# Patient Record
Sex: Female | Born: 1980 | Race: Asian | Hispanic: No | Marital: Married | Smoking: Never smoker
Health system: Southern US, Community
[De-identification: ages and names within clinical notes are randomized; demographics above are authoritative.]

## PROBLEM LIST (undated history)

## (undated) DIAGNOSIS — Z789 Other specified health status: Secondary | ICD-10-CM

## (undated) DIAGNOSIS — T7840XA Allergy, unspecified, initial encounter: Secondary | ICD-10-CM

## (undated) HISTORY — PX: NO PAST SURGERIES: SHX2092

## (undated) HISTORY — DX: Allergy, unspecified, initial encounter: T78.40XA

---

## 2001-07-12 HISTORY — PX: FACIAL COSMETIC SURGERY: SHX629

## 2005-04-08 ENCOUNTER — Inpatient Hospital Stay (HOSPITAL_COMMUNITY): Admission: AD | Admit: 2005-04-08 | Discharge: 2005-04-08 | Payer: Self-pay | Admitting: *Deleted

## 2005-04-09 ENCOUNTER — Inpatient Hospital Stay (HOSPITAL_COMMUNITY): Admission: AD | Admit: 2005-04-09 | Discharge: 2005-04-09 | Payer: Self-pay | Admitting: *Deleted

## 2005-04-09 ENCOUNTER — Encounter (INDEPENDENT_AMBULATORY_CARE_PROVIDER_SITE_OTHER): Payer: Self-pay | Admitting: Specialist

## 2006-01-19 ENCOUNTER — Inpatient Hospital Stay (HOSPITAL_COMMUNITY): Admission: AD | Admit: 2006-01-19 | Discharge: 2006-01-19 | Payer: Self-pay | Admitting: Obstetrics and Gynecology

## 2006-08-14 ENCOUNTER — Inpatient Hospital Stay (HOSPITAL_COMMUNITY): Admission: AD | Admit: 2006-08-14 | Discharge: 2006-08-17 | Payer: Self-pay | Admitting: Obstetrics and Gynecology

## 2008-07-27 ENCOUNTER — Inpatient Hospital Stay (HOSPITAL_COMMUNITY): Admission: AD | Admit: 2008-07-27 | Discharge: 2008-07-29 | Payer: Self-pay | Admitting: Obstetrics and Gynecology

## 2010-10-26 LAB — CBC: MCHC: 32.9 g/dL (ref 30.0–36.0)

## 2010-10-26 LAB — RPR: RPR Ser Ql: NONREACTIVE

## 2010-11-27 NOTE — Discharge Summary (Signed)
NAMEHAVA, Alicia Werner                   ACCOUNT NO.:  [id number]   MEDICAL RECORD NO.:  [id number]          PATIENT TYPE:  INP   LOCATION:  9122                          FACILITY:  [facility]   PHYSICIAN:  Gerrit Friends. Aldona Bar, M.D.   DATE OF BIRTH:  05-31-1981   DATE OF ADMISSION:  08/14/2006  DATE OF DISCHARGE:  08/17/2006                               DISCHARGE SUMMARY   DISCHARGE DIAGNOSES:  1. Term pregnancy, delivered 8 pound 5 ounce female infant, Apgars 8 and      9.  2. Blood type B+.   PROCEDURES:  1. Vacuum extraction assisted delivery.  2. Second degree tear and repair.   SUMMARY:  This 30 year old gravida 3, para 0 was admitted at term in  labor after an uncomplicated pregnancy. She progressed well and required  because of severe variable decelerations and fetal heart a vacuum  extraction assisted delivery which was accomplished without difficulty  with delivery of an 8 pound 5 ounce female infant over a second degree  tear. Apgars were 8 and 9. Postpartum course was totally benign.  Discharge hemoglobin 10.7 with a white count of 10,000, platelet count  140,000. On the morning of February 6 she was ambulating well,  tolerating a regular diet well, having normal bowel and bladder  function. Was afebrile. Her breastfeeding was going well and after  giving her all appropriate instructions per discharge brochure, she was  discharged to home.   DISCHARGE MEDICATIONS:  1. Vitamins - one a day as long she is breastfeeding.  2. Feosol capsules - one at least every other day.  3. Motrin 600 mg every six hours as needed for pain and cramping.  4. Tylox 1-2 every 4-6 hours as needed for more severe pain.   FOLLOWUP:  She will return to the office for followup in approximately  four weeks' time or as needed.   CONDITION ON DISCHARGE:  Improved.      Gerrit Friends. Aldona Bar, M.D.  Electronically Signed     RMW/MEDQ  D:  08/17/2006  T:  08/17/2006  Job:  [id number]

## 2011-11-29 ENCOUNTER — Encounter: Payer: Self-pay | Admitting: *Deleted

## 2011-11-29 ENCOUNTER — Encounter: Payer: 59 | Attending: Obstetrics and Gynecology | Admitting: *Deleted

## 2011-11-29 VITALS — Ht 63.0 in | Wt 127.9 lb

## 2011-11-29 DIAGNOSIS — R7303 Prediabetes: Secondary | ICD-10-CM

## 2011-11-29 DIAGNOSIS — R7309 Other abnormal glucose: Secondary | ICD-10-CM | POA: Insufficient documentation

## 2011-11-29 DIAGNOSIS — Z713 Dietary counseling and surveillance: Secondary | ICD-10-CM | POA: Insufficient documentation

## 2011-11-29 NOTE — Progress Notes (Signed)
  Medical Nutrition Therapy:  Appt start time: 0800 end time:  0900.   Assessment:  Primary concerns today: Pre-diabetes. Patient here with husband regarding abnormal glucose levels. Patient speaks some [place]. Her HgbA1c was 6.1 on 10/18/11. She works as a Advertising account planner. She reports she is trying to lose weight (goal weight 120 pounds) however, current weight (127.9 pounds) is within normal weight range. She has had testing done for food allergies. Testing by [facility] indicates allergies to a wide range of food including shellfish, nuts, a variety of vegetables, fruits, and spices. However, patient only reports adverse effects after eating shellfish and pineapple. See media tab for allergy test results.    MEDICATIONS: Vitamin D supplement   DIETARY INTAKE:   Usual eating pattern includes 2-3 meals and 1-2 snacks per day.  24-hr recall:  B ( AM): Yogurt (homemade, whipped cream, milk) Snk ( AM): None  L ( PM): [employer], vegetable, fish/beef/pork at work from home Snk ( PM): Sweets sometimes D ( PM): Same as lunch Snk ( PM): None usually, dessert (ice cream), chips, popcorn Beverages: Water with honey, coffee with sugar and milk  Usual physical activity: Treadmill 1 hour/day walking/jogging for the last 2 weeks  Estimated energy needs: 1700 calories 190 g carbohydrates 106 g protein 57 g fat  Progress Towards Goal(s):  In progress.   Nutritional Diagnosis:  NB-1.1 Food and nutrition-related knowledge deficit As related to pre-diabetes.  As evidenced by no need for prior education.    Intervention:  Nutrition counseling. Discussed the physiology of pre-diabetes/diabetes, dietary strategies to reduce blood glucose including consistent carbohydrate intake and portion control, and the importance of physical activity. We also briefly discussed the patient's food allergies and which foods to avoid.   Goals: 1. 2-3 carbohydrate servings at meals, 1 carbohydrate serving at  snacks 2. Monitor portion size of carbohydrate containing foods. 3. Reduce sweet/snack food intake, replace with fruit, popcorn 4. Continue to walk/jog on the treadmill at least 4-5 days weekly.   Handouts given during visit include:  Carbohydrate counting booklet  Monitoring/Evaluation:  Dietary intake, exercise, blood glucose, and body weight in 1 month(s).

## 2011-11-29 NOTE — Patient Instructions (Signed)
Goals: 1. 2-3 carbohydrate servings at meals, 1 carbohydrate serving at snacks 2. Monitor portion size of carbohydrate containing foods. 3. Reduce sweet/snack food intake, replace with fruit, popcorn 4. Continue to walk/jog on the treadmill at least 4-5 days weekly.

## 2012-01-03 ENCOUNTER — Encounter: Payer: Self-pay | Admitting: *Deleted

## 2012-01-03 ENCOUNTER — Encounter: Payer: 59 | Attending: Obstetrics and Gynecology | Admitting: *Deleted

## 2012-01-03 VITALS — Wt 123.5 lb

## 2012-01-03 DIAGNOSIS — Z713 Dietary counseling and surveillance: Secondary | ICD-10-CM | POA: Insufficient documentation

## 2012-01-03 DIAGNOSIS — R7309 Other abnormal glucose: Secondary | ICD-10-CM | POA: Insufficient documentation

## 2012-01-03 NOTE — Patient Instructions (Signed)
Goals:  1. 2-3 carbohydrate servings at meals, 1 carbohydrate serving at snacks  2. Continue to monitor portion size of carbohydrate containing foods.  3. Continue to replace sweets intake with fruit, popcorn  4. Continue to walk/jog on the treadmill at least 4-5 days weekly

## 2012-01-03 NOTE — Progress Notes (Signed)
  Medical Nutrition Therapy:  Appt start time: 0800 end time:  0900.   Assessment:  Patient reports making changes to her diet. She is eating less sweets and drinking more water. Her weight is down to 123.5 pounds from 127 pounds. Despite weight in normal BMI range, her reported goal weight is 120 pounds. She has been running 1 hour on the treadmill daily. She has also been taking a lactase supplement on the recommendation of a friend and states that she feels better with meals. We discussed that this is useful when eating dairy products and does not need to be taken with all meals.   MEDICATIONS: Vitamin D, Dairy Digested lactase supplement   DIETARY INTAKE:   Usual eating pattern includes 3 meals and 1-2 snacks per day.  24-hr recall:  B ( AM): Whole wheat, eggs, fat-free cheese Snk ( AM): None  L ( PM): Brown rice, meat/fish/pork, vegetables Snk ( PM): none usually, sometimes fruit D ( PM): Same as lunch Snk ( PM): Fruit Beverages: Water, lemonade  Usual physical activity: Running treadmill 1 hour daily, yoga 3 days a week   Progress Towards Goal(s):  In progress.   Nutritional Diagnosis:  NB-1.1 Food and nutrition-related knowledge deficit As related to pre-diabetes.  As evidenced by no prior education.    Intervention:  Nutrition counseling. Reviewed carbohydrate control with patient and husband. We reviewed portion size and the importance of eating consistent carbs throughout the day.  Goals:  1. 2-3 carbohydrate servings at meals, 1 carbohydrate serving at snacks  2. Continue to monitor portion size of carbohydrate containing foods.  3. Continue to replace sweets intake with fruit, popcorn  4. Continue to walk/jog on the treadmill at least 4-5 days weekly  Handouts given during visit include:  Yellow carb counting handout   Monitoring/Evaluation:  Dietary intake, exercise, and body weight prn.

## 2012-02-20 ENCOUNTER — Ambulatory Visit (INDEPENDENT_AMBULATORY_CARE_PROVIDER_SITE_OTHER): Payer: 59 | Admitting: Emergency Medicine

## 2012-02-20 ENCOUNTER — Ambulatory Visit: Payer: 59

## 2012-02-20 VITALS — BP 94/65 | HR 91 | Temp 98.9°F | Resp 18 | Ht 64.0 in | Wt 122.8 lb

## 2012-02-20 DIAGNOSIS — R509 Fever, unspecified: Secondary | ICD-10-CM

## 2012-02-20 DIAGNOSIS — J158 Pneumonia due to other specified bacteria: Secondary | ICD-10-CM

## 2012-02-20 DIAGNOSIS — J069 Acute upper respiratory infection, unspecified: Secondary | ICD-10-CM

## 2012-02-20 DIAGNOSIS — J189 Pneumonia, unspecified organism: Secondary | ICD-10-CM

## 2012-02-20 LAB — POCT CBC
HCT, POC: 40.4 % (ref 37.7–47.9)
MCV: 85.4 fL (ref 80–97)
MID (cbc): 0.5 (ref 0–0.9)
POC MID %: 4.7 %M (ref 0–12)
Platelet Count, POC: 225 10*3/uL (ref 142–424)
RBC: 4.73 M/uL (ref 4.04–5.48)
RDW, POC: 13 %

## 2012-02-20 LAB — GLUCOSE, POCT (MANUAL RESULT ENTRY): POC Glucose: 84 mg/dl (ref 70–99)

## 2012-02-20 LAB — POCT GLYCOSYLATED HEMOGLOBIN (HGB A1C): Hemoglobin A1C: 5.5

## 2012-02-20 MED ORDER — CEFTRIAXONE SODIUM 1 G IJ SOLR
1.0000 g | Freq: Once | INTRAMUSCULAR | Status: AC
  Administered 2012-02-20: 1 g via INTRAMUSCULAR

## 2012-02-20 MED ORDER — AZITHROMYCIN 250 MG PO TABS: ORAL_TABLET | ORAL | Status: AC

## 2012-02-20 NOTE — Progress Notes (Signed)
  Subjective:    Patient ID: Alicia Werner, female    DOB: Mar 07, 1981, 31 y.o.   MRN: [id number]  HPI 31 year old female comes into because of cough and sore throat has taken tylenol and ibuprofen for fever it doesn't help Cough keeps her up at night coughs up green mucus her chest hurts when she cough    Review of Systems  Constitutional: Positive for fever, chills and fatigue.  HENT: Positive for congestion, sore throat, sneezing and sinus pressure.   Respiratory: Positive for cough and chest tightness (because of cough and congestion).   Neurological: Positive for headaches.       Objective:   Physical Exam  Constitutional: She appears well-developed and well-nourished.  HENT:  Head: Normocephalic.  Eyes: Pupils are equal, round, and reactive to light.  Neck: No thyromegaly present.  Cardiovascular: Normal rate and regular rhythm.   Pulmonary/Chest:       There are rhonchi present right lower lobe right lateral chest   [facility] reading (PRIMARY) by  Dr. Cleta Alberts chest x-ray shows a consolidated right middle lobe pneumonia . Results for orders placed in visit on 02/20/12  POCT CBC      Component Value Range   WBC 10.8 (*) 4.6 - 10.2 K/uL   Lymph, poc 1.1  0.6 - 3.4   POC LYMPH PERCENT 9.9 (*) 10 - 50 %L   MID (cbc) 0.5  0 - 0.9   POC MID % 4.7  0 - 12 %M   POC Granulocyte 9.2 (*) 2 - 6.9   Granulocyte percent 85.4 (*) 37 - 80 %G   RBC 4.73  4.04 - 5.48 M/uL   Hemoglobin 12.1 (*) 12.2 - 16.2 g/dL   HCT, POC 40.9  81.1 - 47.9 %   MCV 85.4  80 - 97 fL   MCH, POC 25.6 (*) 27 - 31.2 pg   MCHC 30.0 (*) 31.8 - 35.4 g/dL   RDW, POC 91.4     Platelet Count, POC 225  142 - 424 K/uL   MPV 10.6  0 - 99.8 fL  GLUCOSE, POCT (MANUAL RESULT ENTRY)      Component Value Range   POC Glucose 84  70 - 99 mg/dl  POCT GLYCOSYLATED HEMOGLOBIN (HGB A1C)      Component Value Range   Hemoglobin A1C 5.5            Assessment & Plan:    Patient to receive 1 g of Rocephin followed by  Zithromax. Recheck 48 hours patient will need repeat chest x-ray in about 2 weeks.

## 2012-02-20 NOTE — Patient Instructions (Addendum)
He needs a recheck in the office in 48 hours on Tuesday. Take antibiotics as prescribed the we will repeat a chest x-ray in 10-14 days.B?nh Vim Ph?i ? Ng??i L?n (Pneumonia, Adult) Vim ph?i l b?nh nhi?m trng ph?i.  NGUYN NHN N c th? gy ra b?i vi khu?n ho?c vi rt. Thng th??ng, cc nhi?m trng ny l do ht cc h?t truy?n nhi?m vo ph?i (???ng h h?p). TRI?U CH?NG   Ho.   S?t.   ?au ng?c.   Th? nhanh.   Kh kh.   S?n sinh d?ch nh?y.  CH?N ?ON N?u b?n c cc tri?u ch?ng ph? bi?n c?a b?nh vim ph?i, chuyn gia ch?m [place] y t? c?a b?n th??ng s? xc nh?n ch?n ?on b?ng X-quang ph?i. X-quang s? hi?n th? s? b?t th??ng trong ph?i (thm nh?p vo ph?i) n?u b?n b? vim ph?i. Cc xt nghi?m mu, n??c ti?u ho?c ??m c th? ???c th?c hi?n ?? tm ra nguyn nhn c? th? c?a b?nh vim ph?i c?a b?n. Chuyn gia ch?m [place] y t? c?a b?n c th? lm cc xt nghi?m (?o p l?c kh trong mu ho?c ?o ?? bo ha -xy) ?? xem ph?i c?a b?n ?ang lm vi?c nh? th? no. ?I?U TR?  M?t s? d?ng vim ph?i c th? ly lan sang ng??i khc khi b?n ho ho?c h?t h?i. B?n c th? ???c yu c?u ?eo kh?u trang tr??c v trong qu trnh khm. Vim ph?i do vi khu?n ???c ?i?u tr? b?ng thu?c khng sinh. Vim ph?i do vi rt cm c th? ???c ?i?u tr? b?ng thu?c khng vi rt. H?u h?t cc b?nh nhi?m vi rt khc ph?i ?i h?t ti?n trnh c?a chng. Cc b?nh nhi?m trng ny s? khng ?p ?ng v?i thu?c khng sinh.  PHNG NG?A Thu?c ch?ng ng?a (lo?i tim) nhi?m ph? c?u trng gip phng ng?a vim ph?i do ph? c?u trng. ?i?u ny th??ng ???c ?? ngh? cho:  Nh?ng ng??i trn 63 tu?i.   B?nh nhn ha tr? li?u.   Nh?ng ng??i c v?n ?? v? ph?i mn tnh, ch?ng h?n nh? vim ti?u ph? qu?n ho?c kh th?ng.   Nh?ng ng??i c v?n ?? h? th?ng mi?n d?ch.  N?u b?n trn 63 tu?i ho?c c m?t tnh tr?ng nguy c? cao, b?n c th? nh?n ???c v?cxin ph? c?u khu?n n?u b?n khng nh?n ???c n tr??c ?. ? m?t s? n??c, v?cxin cm th??ng xuyn c?ng ???c ?? ngh?Marland Kitchen V?cxin ny c th?  gip phng ng?a m?t s? tr??ng h?p vim ph?i. B?n c th? s? ???c cung c?p v?cxin cm nh? l m?t ph?n c?a s? ch?m [place].  N?u b?n ht thu?c, ? t?i lc b? thu?c. B?n c th? ???c h??ng d?n v? cch d?ng ht thu?c t?t nh?t. Chuyn gia ch?m [place] y t? c?a b?n c th? cung c?p thu?c v t? v?n ?? gip b?n b? thu?c l. H??NG D?N CH?M [place] T?I NH  Thu?c ho c th? ???c s? d?ng n?u b?n khng ???c ngh? ng?i nhi?u. Tuy nhin, ho b?o v? b?n b?ng cch lm s?ch ph?i. B?n nn trnh s? d?ng thu?c ho n?u c th?Shaune Pascal gia ch?m [place] y t? c?a b?n c th? ? k ??n thu?c n?u ngh? r?ng b?n b? vim do vi khu?n ho?c cm. Dng h?t chng ngay c? khi b?n b?t ??u c?m th?y kh h?n.   Bc s? ? c?ng c th? k ??n thu?c long ??m c tc d?ng lm bong ??m ?? ho ra.  Ch? dng cc thu?c ???c bn khng c?n ??n thu?c c?a Bc s? ho?c theo toa c?a Bc s? ?? gi?m ?au, kh ch?u, hay s?t theo nh? h??ng d?n c?a Bc s?.   Khng ht thu?c l. Ht thu?c l l nguyn nhn ph? bi?n c?a vim ph? qu?n v c th? gp ph?n vo vim ph?i. N?u b?n ht thu?c v ti?p t?c ht thu?c, ch?ng ho c?a b?n c th? ko di vi tu?n sau khi h?t vim ph?i.   My phun h?i n??c mt ho?c my t?o h?i ?m trong phng hay nh b?n c th? gip lm long d?ch nh?y.   Ho th??ng n?ng h?n vo ban ?m. Ng? ? t? th? n?a th?ng ??ng trong m?t chi?c gh? t?a ho?c s? d?ng m?t ?i g?i d??i ??u s? gip lm d?u b?t v?n ?? ny.   Ngh? ng?i khi b?n c?m th?y c?n. C? th? c?a b?n th??ng s? cho b?n bi?t khi no c?n ngh? ng?i.  HY NGAY L?P T?C THAM V?N V?I CHUYN GIA Y T? N?U:  B?nh c?a b?n tr? nn t? h?n. ?i?u ny ??c bi?t ?ng n?u b?n ? c tu?i ho?c b? suy nh??c do b?t k? b?nh no khc.   B?n khng th? ki?m sot b?nh ho b?ng thu?c ch?a ho v m?t ng?.   B?n b?t ??u ho ra mu.   B?n ngy cng ?au h?n ho?c u?ng thu?c khng c tc d?ng gi?m ?au.   B?n b? s?t.   B?t k? tri?u ch?ng no ban ??u ??a b?n ??n ?i?u tr? tr? nn nghim tr?ng h?n thay v t?t h?n.   B?n pht tri?n th? d?c ho?c ?au  ng?c.  HY CH?C CH?N R?NG B?N:  Hi?u r nh?ng h??ng d?n khi xu?t vi?n.   S? theo di tnh tr?ng b?nh c?a b?n.   S? ??n khm b?nh ngay l?p t?c nh? ? ???c h??ng d?n.  Document Released: 06/28/2005 Document Revised: 06/17/2011 [facility] Patient Information 2012 [place], [place].Pneumonia, Adult Pneumonia is an infection of the lungs.  CAUSES Pneumonia may be caused by bacteria or a virus. Usually, these infections are caused by breathing infectious particles into the lungs (respiratory tract). SYMPTOMS   Cough.   Fever.   Chest pain.   Increased rate of breathing.   Wheezing.   Mucus production.  DIAGNOSIS  If you have the common symptoms of pneumonia, your caregiver will typically confirm the diagnosis with a chest X-ray. The X-ray will show an abnormality in the lung (pulmonary infiltrate) if you have pneumonia. Other tests of your blood, urine, or sputum may be done to find the specific cause of your pneumonia. Your caregiver may also do tests (blood gases or pulse oximetry) to see how well your lungs are working. TREATMENT  Some forms of pneumonia may be spread to other people when you cough or sneeze. You may be asked to wear a mask before and during your exam. Pneumonia that is caused by bacteria is treated with antibiotic medicine. Pneumonia that is caused by the influenza virus may be treated with an antiviral medicine. Most other viral infections must run their course. These infections will not respond to antibiotics.  PREVENTION A pneumococcal shot (vaccine) is available to prevent a common bacterial cause of pneumonia. This is usually suggested for:  People over 2 years old.   Patients on chemotherapy.   People with chronic lung problems, such as bronchitis or emphysema.   People with immune system problems.  If you are over 65 or  have a high risk condition, you may receive the pneumococcal vaccine if you have not received it before. In some countries, a routine  influenza vaccine is also recommended. This vaccine can help prevent some cases of pneumonia.You may be offered the influenza vaccine as part of your care. If you smoke, it is time to quit. You may receive instructions on how to stop smoking. Your caregiver can provide medicines and counseling to help you quit. HOME CARE INSTRUCTIONS   Cough suppressants may be used if you are losing too much rest. However, coughing protects you by clearing your lungs. You should avoid using cough suppressants if you can.   Your caregiver may have prescribed medicine if he or she thinks your pneumonia is caused by a bacteria or influenza. Finish your medicine even if you start to feel better.   Your caregiver may also prescribe an expectorant. This loosens the mucus to be coughed up.   Only take over-the-counter or prescription medicines for pain, discomfort, or fever as directed by your caregiver.   Do not smoke. Smoking is a common cause of bronchitis and can contribute to pneumonia. If you are a smoker and continue to smoke, your cough may last several weeks after your pneumonia has cleared.   A cold steam vaporizer or humidifier in your room or home may help loosen mucus.   Coughing is often worse at night. Sleeping in a semi-upright position in a recliner or using a couple pillows under your head will help with this.   Get rest as you feel it is needed. Your body will usually let you know when you need to rest.  SEEK IMMEDIATE MEDICAL CARE IF:   Your illness becomes worse. This is especially true if you are elderly or weakened from any other disease.   You cannot control your cough with suppressants and are losing sleep.   You begin coughing up blood.   You develop pain which is getting worse or is uncontrolled with medicines.   You have a fever.   Any of the symptoms which initially brought you in for treatment are getting worse rather than better.   You develop shortness of breath or chest  pain.  MAKE SURE YOU:   Understand these instructions.   Will watch your condition.   Will get help right away if you are not doing well or get worse.  Document Released: 06/28/2005 Document Revised: 06/17/2011 Document Reviewed: 09/17/2010 [facility] Patient Information 2012 [place], [place].

## 2012-02-22 ENCOUNTER — Ambulatory Visit (INDEPENDENT_AMBULATORY_CARE_PROVIDER_SITE_OTHER): Payer: 59 | Admitting: Family Medicine

## 2012-02-22 VITALS — BP 99/67 | HR 82 | Temp 98.1°F | Resp 16 | Ht 64.0 in | Wt 121.0 lb

## 2012-02-22 DIAGNOSIS — J189 Pneumonia, unspecified organism: Secondary | ICD-10-CM

## 2012-02-22 LAB — POCT CBC
Granulocyte percent: 69.3 %G (ref 37–80)
HCT, POC: 40 % (ref 37.7–47.9)
Hemoglobin: 12.3 g/dL (ref 12.2–16.2)
Lymph, poc: 1.9 (ref 0.6–3.4)
MCH, POC: 26.4 pg — AB (ref 27–31.2)
MCHC: 30.8 g/dL — AB (ref 31.8–35.4)
MCV: 85.8 fL (ref 80–97)
MID (cbc): 0.6 (ref 0–0.9)
MPV: 9.8 fL (ref 0–99.8)
POC Granulocyte: 5.6 (ref 2–6.9)
POC LYMPH PERCENT: 23.9 %L (ref 10–50)
POC MID %: 6.8 %M (ref 0–12)
Platelet Count, POC: 281 10*3/uL (ref 142–424)
RBC: 4.66 M/uL (ref 4.04–5.48)
RDW, POC: 12.8 %
WBC: 8.1 10*3/uL (ref 4.6–10.2)

## 2012-02-22 MED ORDER — HYDROCODONE-HOMATROPINE 5-1.5 MG/5ML PO SYRP
5.0000 mL | ORAL_SOLUTION | Freq: Three times a day (TID) | ORAL | Status: AC | PRN
Start: 2012-02-22 — End: 2012-03-03

## 2012-02-22 NOTE — Progress Notes (Signed)
This is a 31 year old [place] woman who was diagnosed with pneumonia yesterday.  She was given a shot of Rocephin and started on azithromycin. Overnight patient was very uncomfortable with cough but had no known fever. She's had no shortness of breath. She says that her right ear is a bit uncomfortable as well as her right neck.  She's been healthy in general her whole life the last week a friend died and she was out late every night to do the family according to tradition.  Objective: Patient shows no sign of respiratory difficulty, she is a bit haggard, however  HEENT: Normal  Chest: Decreased breath sounds right anterior lung fields  Heart: Regular no murmur  Skin: No rashes Results for orders placed in visit on 02/22/12  POCT CBC      Component Value Range   WBC 8.1  4.6 - 10.2 K/uL   Lymph, poc 1.9  0.6 - 3.4   POC LYMPH PERCENT 23.9  10 - 50 %L   MID (cbc) 0.6  0 - 0.9   POC MID % 6.8  0 - 12 %M   POC Granulocyte 5.6  2 - 6.9   Granulocyte percent 69.3  37 - 80 %G   RBC 4.66  4.04 - 5.48 M/uL   Hemoglobin 12.3  12.2 - 16.2 g/dL   HCT, POC 16.1  09.6 - 47.9 %   MCV 85.8  80 - 97 fL   MCH, POC 26.4 (*) 27 - 31.2 pg   MCHC 30.8 (*) 31.8 - 35.4 g/dL   RDW, POC 04.5     Platelet Count, POC 281  142 - 424 K/uL   MPV 9.8  0 - 99.8 fL    Assessment:  No acute deterioration but patient is uncomfortable  Plan: continue current meds. 1. Pneumonia  POCT CBC, HYDROcodone-homatropine (HYCODAN) 5-1.5 MG/5ML syrup   Recheck in 48 hours

## 2012-02-24 ENCOUNTER — Ambulatory Visit (INDEPENDENT_AMBULATORY_CARE_PROVIDER_SITE_OTHER): Payer: 59 | Admitting: Emergency Medicine

## 2012-02-24 ENCOUNTER — Ambulatory Visit: Payer: 59

## 2012-02-24 VITALS — BP 97/61 | HR 79 | Temp 98.2°F | Resp 16 | Ht 64.0 in | Wt 122.0 lb

## 2012-02-24 DIAGNOSIS — J189 Pneumonia, unspecified organism: Secondary | ICD-10-CM

## 2012-02-24 DIAGNOSIS — M79609 Pain in unspecified limb: Secondary | ICD-10-CM

## 2012-02-24 DIAGNOSIS — M79604 Pain in right leg: Secondary | ICD-10-CM

## 2012-02-24 DIAGNOSIS — R110 Nausea: Secondary | ICD-10-CM

## 2012-02-24 NOTE — Progress Notes (Signed)
  Subjective:    Patient ID: Alicia Werner, female    DOB: [birth year], 31 y.o.   MRN: [id number]  HPI patient under treatment for pneumonia. She is on call for medications at night and recently finished a Z-Pak. She had some nausea and felt some weakness on the right side of her body however she had a red treatment followed by coining and now feels better today her normal breakfast and a normal lunch and feels well today. I discussed case with her husband and she is having tightness in the shoulder muscles on the right and pain of her right leg and buttocks where she had a Rocephin shot .    Review of Systems     Objective:   Physical Exam patient was fine she is in no distress. Her neck is supple. She has had coining procedure done on the back of her neck and shoulders. Her strength of the upper extremities and lower extremities is normal. There are no focal neurological signs. Her chest today was clear to percussion and her cardiac exam is unremarkable  [facility] reading (PRIMARY) by  Dr. Cleta Alberts decrease in right middle lobe infiltrate with some improved aeration         Assessment & Plan:  Chest x-ray is definitely better I'm not sure what to make of the right-sided weakness and some of her symptoms may be lost in translation. Apparently she had a massage last night and feels better today and had a normal breakfast and lunch and plans to return to work. Her breathing has been normal. I did not detect any abnormalities on her neurological exam.

## 2012-09-09 ENCOUNTER — Encounter: Payer: Self-pay | Admitting: *Deleted

## 2012-09-09 DIAGNOSIS — L2089 Other atopic dermatitis: Secondary | ICD-10-CM | POA: Insufficient documentation

## 2012-09-09 DIAGNOSIS — J3089 Other allergic rhinitis: Secondary | ICD-10-CM | POA: Insufficient documentation

## 2012-09-09 DIAGNOSIS — H1045 Other chronic allergic conjunctivitis: Secondary | ICD-10-CM | POA: Insufficient documentation

## 2012-09-09 DIAGNOSIS — T783XXA Angioneurotic edema, initial encounter: Secondary | ICD-10-CM | POA: Insufficient documentation

## 2012-11-05 ENCOUNTER — Ambulatory Visit (INDEPENDENT_AMBULATORY_CARE_PROVIDER_SITE_OTHER): Payer: 59 | Admitting: Family Medicine

## 2012-11-05 VITALS — BP 94/59 | HR 76 | Temp 97.9°F | Resp 16 | Ht 64.0 in | Wt 125.4 lb

## 2012-11-05 DIAGNOSIS — J302 Other seasonal allergic rhinitis: Secondary | ICD-10-CM

## 2012-11-05 DIAGNOSIS — J309 Allergic rhinitis, unspecified: Secondary | ICD-10-CM

## 2012-11-05 MED ORDER — OLOPATADINE HCL 0.1 % OP SOLN: 1.0000 [drp] | Freq: Two times a day (BID) | OPHTHALMIC | Status: DC

## 2012-11-05 MED ORDER — PREDNISONE 20 MG PO TABS: ORAL_TABLET | ORAL | Status: DC

## 2012-11-05 MED ORDER — MOMETASONE FUROATE 50 MCG/ACT NA SUSP: 2.0000 | Freq: Every day | NASAL | Status: DC

## 2012-11-05 NOTE — Patient Instructions (Addendum)
Allergic Rhinitis  Allergic rhinitis is when the mucous membranes in the nose respond to allergens. Allergens are particles in the air that cause your body to have an allergic reaction. This causes you to release allergic antibodies. Through a chain of events, these eventually cause you to release histamine into the blood stream (hence the use of antihistamines). Although meant to be protective to the body, it is this release that causes your discomfort, such as frequent sneezing, congestion and an itchy runny nose.    CAUSES    The pollen allergens may come from grasses, trees, and weeds. This is seasonal allergic rhinitis, or "hay fever." Other allergens cause year-round allergic rhinitis (perennial allergic rhinitis) such as house dust mite allergen, pet dander and mold spores.    SYMPTOMS     Nasal stuffiness (congestion).   Runny, itchy nose with sneezing and tearing of the eyes.   There is often an itching of the mouth, eyes and ears.  It cannot be cured, but it can be controlled with medications.  DIAGNOSIS    If you are unable to determine the offending allergen, skin or blood testing may find it.  TREATMENT     Avoid the allergen.   Medications and allergy shots (immunotherapy) can help.   Hay fever may often be treated with antihistamines in pill or nasal spray forms. Antihistamines block the effects of histamine. There are over-the-counter medicines that may help with nasal congestion and swelling around the eyes. Check with your caregiver before taking or giving this medicine.  If the treatment above does not work, there are many new medications your caregiver can prescribe. Stronger medications may be used if initial measures are ineffective. Desensitizing injections can be used if medications and avoidance fails. Desensitization is when a patient is given ongoing shots until the body becomes less sensitive to the allergen. Make sure you follow up with your caregiver if problems continue.   SEEK MEDICAL CARE IF:     You develop fever (more than 100.5 F (38.1 C).   You develop a cough that does not stop easily (persistent).   You have shortness of breath.   You start wheezing.   Symptoms interfere with normal daily activities.  Document Released: 03/23/2001 Document Revised: 09/20/2011 Document Reviewed: 10/02/2008  [employer] Patient Information 2013 [employer].

## 2012-11-05 NOTE — Progress Notes (Signed)
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 She has gotten much more swelling in cheeks lately associated with nasal congestion, right pink eye, sore throat. Gets allergy shots x 3 years without improvements.   Obj:  Allergic facies HEENT:  Marked nasal passage swelling Eye:  Right eye injected Oroph:  PND only  Assessment and Plan:  Seasonal allergies - Plan: olopatadine (PATANOL) 0.1 % ophthalmic solution, predniSONE (DELTASONE) 20 MG tablet, mometasone (NASONEX) 50 MCG/ACT nasal spray

## 2012-11-21 ENCOUNTER — Other Ambulatory Visit: Payer: Self-pay | Admitting: Obstetrics and Gynecology

## 2012-12-12 ENCOUNTER — Ambulatory Visit (INDEPENDENT_AMBULATORY_CARE_PROVIDER_SITE_OTHER): Payer: 59 | Admitting: Family Medicine

## 2012-12-12 VITALS — BP 110/80 | HR 71 | Temp 98.0°F | Resp 16 | Ht 64.0 in | Wt 125.0 lb

## 2012-12-12 DIAGNOSIS — R7309 Other abnormal glucose: Secondary | ICD-10-CM

## 2012-12-12 DIAGNOSIS — B353 Tinea pedis: Secondary | ICD-10-CM

## 2012-12-12 DIAGNOSIS — R739 Hyperglycemia, unspecified: Secondary | ICD-10-CM

## 2012-12-12 LAB — GLUCOSE, POCT (MANUAL RESULT ENTRY): POC Glucose: 84 mg/dl (ref 70–99)

## 2012-12-12 LAB — POCT GLYCOSYLATED HEMOGLOBIN (HGB A1C): Hemoglobin A1C: 5.5

## 2012-12-12 MED ORDER — TERBINAFINE HCL 1 % EX CREA: TOPICAL_CREAM | Freq: Two times a day (BID) | CUTANEOUS | Status: DC

## 2012-12-12 NOTE — Progress Notes (Signed)
Subjective:    Patient ID: Alicia Werner, female    DOB: 1980/10/02, 32 y.o.   MRN: [id number] Chief Complaint  Patient presents with  . Hyperglycemia    and also pt is always cold  . itching both heels    HPI  Alicia Werner is a 32 yo female w/ a PMHx of hyperglycemia, referred to nutrition last yr by ob-gyn as hgb was 6.1.  Nutrition found her hgb to be 5.5. Since then pt has been diligent about getting plenty of exercise and watching her diet - low fat, low carb.  She then saw her gyn, Dr. Dareen Piano at [facility] about 3 wks ago - they did blood work and told her she was almost diabetic - told sugar was very high.  She has been checking her sugars some mornings and is usually around 84-102. Her husband also has type 2 DM and he is very fit, then - but it run strongly in his family.  She has been using his meter.  Both heals very itchy for about 10d - 2wks - not using any lotions or products.  Only person in Alicia Werner's family w/ t2DM is her maternal GM.  Past Medical History  Diagnosis Date  . Allergy    Current Outpatient Prescriptions on File Prior to Visit  Medication Sig Dispense Refill  . cholecalciferol (VITAMIN D) 1000 UNITS tablet Take 1,000 Units by mouth daily.      . fish oil-omega-3 fatty acids 1000 MG capsule Take 2 g by mouth daily.      . mometasone (NASONEX) 50 MCG/ACT nasal spray Place 2 sprays into the nose daily.  17 g  12  . olopatadine (PATANOL) 0.1 % ophthalmic solution Place 1 drop into the right eye 2 (two) times daily.  5 mL  12  . phenylephrine (SUDAFED PE) 10 MG TABS Take 10 mg by mouth every 4 (four) hours as needed.      . predniSONE (DELTASONE) 20 MG tablet 2 daily with food  10 tablet  1   No current facility-administered medications on file prior to visit.   Allergies  Allergen Reactions  . Shellfish Allergy Hives and Swelling    Patient reports facial swelling, redness, and hives after eating shellfish. Allergy testing done.   Jacelyn Grip    Patient reports scratchy, sore throat after eating.    Family History  Problem Relation Age of Onset  . Asthma Son    History   Social History  . Marital Status: Married    Spouse Name: N/A    Number of Children: N/A  . Years of Education: N/A   Social History Main Topics  . Smoking status: Never Smoker   . Smokeless tobacco: None  . Alcohol Use: No  . Drug Use: No  . Sexually Active: Yes   Other Topics Concern  . None   Social History Narrative  . None   History reviewed. No pertinent past surgical history.    Review of Systems  Constitutional: Positive for chills, activity change and appetite change. Negative for fever, diaphoresis, fatigue and unexpected weight change.  Endocrine: Positive for cold intolerance. Negative for heat intolerance, polydipsia and polyphagia.  Musculoskeletal: Negative for myalgias, joint swelling and arthralgias.  Skin: Positive for rash.  Hematological: Negative for adenopathy. Does not bruise/bleed easily.      BP 110/80  Pulse 71  Temp(Src) 98 F (36.7 C) (Oral)  Resp 16  Ht 5\' 4"  (1.626 m)  Wt  125 lb (56.7 kg)  BMI 21.45 kg/m2  SpO2 100%  LMP 11/23/2012 Objective:   Physical Exam  Constitutional: She is oriented to person, place, and time. She appears well-developed and well-nourished. No distress.  HENT:  Head: Normocephalic and atraumatic.  Right Ear: External ear normal.  Left Ear: External ear normal.  Eyes: Conjunctivae are normal. No scleral icterus.  Neck: Normal range of motion. Neck supple. No thyromegaly present.  Cardiovascular: Normal rate, regular rhythm, normal heart sounds and intact distal pulses.   Pulmonary/Chest: Effort normal and breath sounds normal. No respiratory distress.  Musculoskeletal: She exhibits no edema.  Lymphadenopathy:    She has no cervical adenopathy.  Neurological: She is alert and oriented to person, place, and time.  Skin: Skin is warm and dry. She is not diaphoretic. No erythema.   Psychiatric: She has a normal mood and affect. Her behavior is normal.      Results for orders placed in visit on 12/12/12  POCT GLYCOSYLATED HEMOGLOBIN (HGB A1C)      Result Value Range   Hemoglobin A1C 5.5    GLUCOSE, POCT (MANUAL RESULT ENTRY)      Result Value Range   POC Glucose 84  70 - 99 mg/dl   Called [facility] to get copy of her labs faxed - they faxed a copy of 10/2011 labs which were already scanned into Epic.  Assistant called [facility] - after 20 min on hold was finally told that in 10/2011, pt's hgba1c was 6.4 and glucose was 81.  Copy of labs was never faxed as request. Assessment & Plan:  Hyperglycemia - Plan: POCT glycosylated hemoglobin (Hb A1C), POCT glucose (manual entry), Hemoglobin A1c, CANCELED: Hemoglobin A1c - 6.4 3 wks ago and now 5.5 w/o intervention.  Pt has not risk factors for DM.  Will send off a1c to [facility] for confirmation. Check cbgs 3-4x/wk - either before breakfast goal ~100 or 1-2 hrs after lunch goal <150.  If not at goal, RTC for further eval.  Tinea pedis  Meds ordered this encounter  Medications  . terbinafine (LAMISIL AT) 1 % cream    Sig: Apply topically 2 (two) times daily.    Dispense:  42 g    Refill:  2

## 2012-12-14 ENCOUNTER — Telehealth: Payer: Self-pay

## 2012-12-14 MED ORDER — TERBINAFINE HCL 1 % EX CREA: TOPICAL_CREAM | Freq: Two times a day (BID) | CUTANEOUS | Status: DC

## 2012-12-14 NOTE — Telephone Encounter (Signed)
Resent

## 2012-12-14 NOTE — Telephone Encounter (Signed)
Pt needs a cream that was prescribed at a visit this week.  Needs it sent to the [facility] on [address].  [phone]

## 2012-12-18 ENCOUNTER — Telehealth: Payer: Self-pay

## 2012-12-18 MED ORDER — KETOCONAZOLE 2 % EX CREA: TOPICAL_CREAM | Freq: Two times a day (BID) | CUTANEOUS | Status: DC

## 2012-12-18 NOTE — Telephone Encounter (Signed)
Pt husband is calling for his wife something to do with a prescription and her heel itching Call back number is [phone] He is on HIPPA form

## 2012-12-18 NOTE — Telephone Encounter (Signed)
Per the patient's husband, the terbinafine cream prescription wasn't sent. I spoke to the pharmacy, who advised me that they received the Rx, but it's not covered by the patient's insurance, so they placed it on hold. I cancelled the terbinafine and called in ketoconazole cream in it's place, in hopes the insurance will cover it.

## 2012-12-19 NOTE — Telephone Encounter (Signed)
Thanks, I have called to advise.

## 2013-08-05 ENCOUNTER — Ambulatory Visit (INDEPENDENT_AMBULATORY_CARE_PROVIDER_SITE_OTHER): Payer: 59 | Admitting: Emergency Medicine

## 2013-08-05 VITALS — BP 94/58 | HR 76 | Temp 98.0°F | Resp 16 | Ht 64.0 in | Wt 131.6 lb

## 2013-08-05 DIAGNOSIS — N912 Amenorrhea, unspecified: Secondary | ICD-10-CM

## 2013-08-05 LAB — COMPREHENSIVE METABOLIC PANEL
ALBUMIN: 4.1 g/dL (ref 3.5–5.2)
ALK PHOS: 37 U/L — AB (ref 39–117)
ALT: 12 U/L (ref 0–35)
AST: 17 U/L (ref 0–37)
BUN: 5 mg/dL — ABNORMAL LOW (ref 6–23)
CALCIUM: 8.7 mg/dL (ref 8.4–10.5)
CO2: 25 meq/L (ref 19–32)
Chloride: 106 mEq/L (ref 96–112)
Creat: 0.59 mg/dL (ref 0.50–1.10)
GLUCOSE: 91 mg/dL (ref 70–99)
POTASSIUM: 3.7 meq/L (ref 3.5–5.3)
Sodium: 138 mEq/L (ref 135–145)
Total Bilirubin: 0.5 mg/dL (ref 0.3–1.2)
Total Protein: 6.7 g/dL (ref 6.0–8.3)

## 2013-08-05 LAB — POCT URINE PREGNANCY: Preg Test, Ur: POSITIVE

## 2013-08-05 LAB — HEPATITIS C ANTIBODY: HCV AB: NEGATIVE

## 2013-08-05 LAB — TSH: TSH: 0.67 u[IU]/mL (ref 0.350–4.500)

## 2013-08-05 MED ORDER — POLYETHYLENE GLYCOL 3350 17 GM/SCOOP PO POWD: 17.0000 g | Freq: Every day | ORAL | Status: DC

## 2013-08-05 MED ORDER — PRENATAL VITAMINS 0.8 MG PO TABS: 1.0000 | ORAL_TABLET | Freq: Every day | ORAL | Status: DC

## 2013-08-05 NOTE — Progress Notes (Signed)
[facility] [address], [place] [id number] [phone]  Date:  08/05/2013   Name:  Alicia Werner   DOB:  October 23, 1980   MRN:  [id number]  PCP:  Loney LaurenceHORVATH,MICHELLE A, MD    Chief Complaint: Constipation, Abdominal Pain, Amenorrhea and Liver function test   History of Present Illness:  Alicia Werner is a 33 y.o. very pleasant female patient who presents with the following:  Numerous complaints.  Has history of allergy to shellfish, molluscs, and fin fish as well as numerous food and peanuts.  Eats them anyway.  Has chronic allergies.  Stopped immunotherapy shots a month ago after three years.   Has chronic constipation.  No bleeding.  Some abdominal cramping.   Lost her nuva ring and has missed her last menses.  No improvement with over the counter medications or other home remedies. Denies other complaint or health concern today.  [id number]  Patient Active Problem List   Diagnosis Date Noted  . Allergic rhinitis due to other allergen 09/09/2012  . Other chronic allergic conjunctivitis 09/09/2012  . 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 Other atopic dermatitis and related conditions 09/09/2012    Past Medical History  Diagnosis Date  . Allergy     History reviewed. No pertinent past surgical history.  History  Substance Use Topics  . Smoking status: Never Smoker   . Smokeless tobacco: Not on file  . Alcohol Use: No    Family History  Problem Relation Age of Onset  . Asthma Son   . Cancer Maternal Uncle     Stomach CA    Allergies  Allergen Reactions  . Shellfish Allergy Hives and Swelling    Patient reports facial swelling, redness, and hives after eating shellfish. Allergy testing done.   . Peanuts [Peanut Oil]   . Pineapple     Patient reports scratchy, sore throat after eating.     Medication list has been reviewed and updated.  Current Outpatient Prescriptions on File Prior to Visit  Medication Sig Dispense Refill   . cholecalciferol (VITAMIN D) 1000 UNITS tablet Take 1,000 Units by mouth daily.      . fish oil-omega-3 fatty acids 1000 MG capsule Take 2 g by mouth daily.      Marland Kitchen. ketoconazole (NIZORAL) 2 % cream Apply topically 2 (two) times daily.  60 g  0  . mometasone (NASONEX) 50 MCG/ACT nasal spray Place 2 sprays into the nose daily.  17 g  12  . olopatadine (PATANOL) 0.1 % ophthalmic solution Place 1 drop into the right eye 2 (two) times daily.  5 mL  12  . phenylephrine (SUDAFED PE) 10 MG TABS Take 10 mg by mouth every 4 (four) hours as needed.      . predniSONE (DELTASONE) 20 MG tablet 2 daily with food  10 tablet  1   No current facility-administered medications on file prior to visit.    Review of Systems:  As per HPI, otherwise negative.    Physical Examination: Filed Vitals:   08/05/13 0958  BP: 94/58  Pulse: 76  Temp: 98 F (36.7 C)  Resp: 16   Filed Vitals:   08/05/13 0958  Height: 5\' 4"  (1.626 m)  Weight: 131 lb 9.6 oz (59.693 kg)   Body mass index is 22.58 kg/(m^2). Ideal Body Weight: Weight in (lb) to have BMI = 25: 145.3  GEN: WDWN, NAD, Non-toxic, A & O x 3 HEENT: Atraumatic, Normocephalic. Neck supple.  No masses, No LAD. Ears and Nose: No external deformity. CV: RRR, No M/G/R. No JVD. No thrill. No extra heart sounds. PULM: CTA B, no wheezes, crackles, rhonchi. No retractions. No resp. distress. No accessory muscle use. ABD: S, NT, ND, +BS. No rebound. No HSM. EXTR: No c/c/e NEURO Normal gait.  PSYCH: Normally interactive. Conversant. Not depressed or anxious appearing.  Calm demeanor.    Assessment and Plan: Pregnancy Constipation Exposure to Hep C Multiple allergies.  STRONGLY advised to return to her allergen's care and avoid seafood.  Signed,  Phillips Odor, MD   Results for orders placed in visit on 08/05/13  POCT URINE PREGNANCY      Result Value Range   Preg Test, Ur Positive

## 2013-08-05 NOTE — Patient Instructions (Signed)

## 2013-08-09 ENCOUNTER — Telehealth: Payer: Self-pay

## 2013-08-09 NOTE — Telephone Encounter (Signed)
Husband called for labs, reviewed labs with patient's husband with understanding.

## 2014-03-27 ENCOUNTER — Encounter (HOSPITAL_COMMUNITY): Payer: Self-pay | Admitting: *Deleted

## 2014-03-27 NOTE — H&P (Signed)
33 y.o. [id number] @ [redacted]w[redacted]d comes in for a primary cesarean section due to fetal malpresentation.  Patient has good fetal movement and no bleeding.   [place] in the office yesterday confirmed breech presentation.    Past Medical History  Diagnosis Date  . Allergy   . Medical history non-contributory   . Vaginal delivery 2008, 2010    Past Surgical History  Procedure Laterality Date  . Facial cosmetic surgery  2003  . No past surgeries      OB History  Gravida Para Term Preterm AB SAB TAB Ectopic Multiple Living  # Outcome Date GA Lbr Len/2nd Weight Sex Delivery Anes PTL Lv  5 CUR           4 SAB           3 SAB           2 TRM           1 TRM               History   Social History  . Marital Status: Married    Spouse Name: N/A    Number of Children: N/A  . Years of Education: N/A   Occupational History  . Not on file.   Social History Main Topics  . Smoking status: Never Smoker   . Smokeless tobacco: Not on file  . Alcohol Use: No  . Drug Use: No  . Sexual Activity: Yes   Other Topics Concern  . Not on file   Social History Narrative  . No narrative on file   Shellfish allergy; Peanuts; and Pineapple   Prenatal Course: uncomplicated   Prenatal Transfer Tool  Maternal Diabetes: No Genetic Screening: Normal NIPT Maternal Ultrasounds/Referrals: Normal Fetal Ultrasounds or other Referrals:  None Maternal Substance Abuse:  No Significant Maternal Medications:  None Significant Maternal Lab Results: Lab values include: Group B Strep positive   Filed Vitals:   03/28/14 1117  BP: 107/62  Pulse: 79  Temp: 97.9 F (36.6 C)  Resp: 18     Lungs/Cor:  NAD Abdomen:  soft, gravid Ex:  no cords, erythema SVE:  N/A FHTs:  138 [place]: breech presentation confirmed.   A/P   [id number] @ [redacted]w[redacted]d for 1 LTCS and BTL 2/2 malpresentation term.  All risks, benefits and alternatives discussed with patient and she desires to proceed.   Reviewed the permanent  nature of BTL with patient and her husband and she wishes to proceed.  Consent obtained.   [place], [facility]

## 2014-03-28 ENCOUNTER — Encounter (HOSPITAL_COMMUNITY)
Admission: RE | Disposition: A | Discharge status: Home / Self Care | Payer: Self-pay | Source: Ambulatory Visit | Attending: Obstetrics

## 2014-03-28 ENCOUNTER — Inpatient Hospital Stay (HOSPITAL_COMMUNITY)
Admission: RE | Admit: 2014-03-28 | Discharge: 2014-03-30 | DRG: 765 | Disposition: A | Discharge status: Home / Self Care | Payer: 59 | Source: Ambulatory Visit | Attending: Obstetrics | Admitting: Obstetrics

## 2014-03-28 ENCOUNTER — Encounter (HOSPITAL_COMMUNITY): Payer: 59 | Admitting: Anesthesiology

## 2014-03-28 ENCOUNTER — Inpatient Hospital Stay (HOSPITAL_COMMUNITY): Payer: 59 | Admitting: Anesthesiology

## 2014-03-28 ENCOUNTER — Encounter (HOSPITAL_COMMUNITY): Payer: Self-pay | Admitting: *Deleted

## 2014-03-28 DIAGNOSIS — O9902 Anemia complicating childbirth: Secondary | ICD-10-CM | POA: Diagnosis present

## 2014-03-28 DIAGNOSIS — O09529 Supervision of elderly multigravida, unspecified trimester: Secondary | ICD-10-CM | POA: Diagnosis present

## 2014-03-28 DIAGNOSIS — O321XX0 Maternal care for breech presentation, not applicable or unspecified: Secondary | ICD-10-CM | POA: Diagnosis present

## 2014-03-28 DIAGNOSIS — Z302 Encounter for sterilization: Secondary | ICD-10-CM | POA: Diagnosis not present

## 2014-03-28 DIAGNOSIS — O4100X0 Oligohydramnios, unspecified trimester, not applicable or unspecified: Secondary | ICD-10-CM | POA: Diagnosis present

## 2014-03-28 DIAGNOSIS — D649 Anemia, unspecified: Secondary | ICD-10-CM | POA: Diagnosis present

## 2014-03-28 DIAGNOSIS — O99892 Other specified diseases and conditions complicating childbirth: Secondary | ICD-10-CM | POA: Diagnosis present

## 2014-03-28 DIAGNOSIS — O9989 Other specified diseases and conditions complicating pregnancy, childbirth and the puerperium: Secondary | ICD-10-CM

## 2014-03-28 DIAGNOSIS — Z22330 Carrier of Group B streptococcus: Secondary | ICD-10-CM | POA: Diagnosis not present

## 2014-03-28 HISTORY — DX: Other specified health status: Z78.9

## 2014-03-28 LAB — CBC
HEMATOCRIT: 37.1 % (ref 36.0–46.0)
HEMOGLOBIN: 12.3 g/dL (ref 12.0–15.0)
MCH: 27.9 pg (ref 26.0–34.0)
MCHC: 33.2 g/dL (ref 30.0–36.0)
MCV: 84.1 fL (ref 78.0–100.0)
Platelets: 152 10*3/uL (ref 150–400)
RBC: 4.41 MIL/uL (ref 3.87–5.11)
RDW: 13.5 % (ref 11.5–15.5)
WBC: 11.4 10*3/uL — ABNORMAL HIGH (ref 4.0–10.5)

## 2014-03-28 LAB — ABO/RH: ABO/RH(D): B POS

## 2014-03-28 LAB — RPR

## 2014-03-28 SURGERY — Surgical Case
Anesthesia: Spinal

## 2014-03-28 MED ORDER — PHENYLEPHRINE 40 MCG/ML (10ML) SYRINGE FOR IV PUSH (FOR BLOOD PRESSURE SUPPORT)
PREFILLED_SYRINGE | INTRAVENOUS | Status: AC
  Filled 2014-03-28: qty 5

## 2014-03-28 MED ORDER — PHENYLEPHRINE HCL 10 MG/ML IJ SOLN
INTRAMUSCULAR | Status: DC | PRN
  Administered 2014-03-28: 80 ug via INTRAVENOUS

## 2014-03-28 MED ORDER — SCOPOLAMINE 1 MG/3DAYS TD PT72
1.0000 | MEDICATED_PATCH | Freq: Once | TRANSDERMAL | Status: DC
  Administered 2014-03-28: 1.5 mg via TRANSDERMAL

## 2014-03-28 MED ORDER — ONDANSETRON HCL 4 MG/2ML IJ SOLN
INTRAMUSCULAR | Status: DC | PRN
  Administered 2014-03-28: 4 mg via INTRAVENOUS

## 2014-03-28 MED ORDER — MENTHOL 3 MG MT LOZG
1.0000 | LOZENGE | OROMUCOSAL | Status: DC | PRN
Start: 2014-03-28 — End: 2014-03-30

## 2014-03-28 MED ORDER — SIMETHICONE 80 MG PO CHEW
80.0000 mg | CHEWABLE_TABLET | Freq: Three times a day (TID) | ORAL | Status: DC
  Administered 2014-03-29 – 2014-03-30 (×3): 80 mg via ORAL
  Filled 2014-03-28 (×4): qty 1

## 2014-03-28 MED ORDER — KETOROLAC TROMETHAMINE 30 MG/ML IJ SOLN
30.0000 mg | Freq: Four times a day (QID) | INTRAMUSCULAR | Status: AC | PRN
  Administered 2014-03-28 (×2): 30 mg via INTRAVENOUS
  Filled 2014-03-28: qty 1

## 2014-03-28 MED ORDER — SENNOSIDES-DOCUSATE SODIUM 8.6-50 MG PO TABS
2.0000 | ORAL_TABLET | ORAL | Status: DC
  Administered 2014-03-28 – 2014-03-30 (×2): 2 via ORAL
  Filled 2014-03-28 (×2): qty 2

## 2014-03-28 MED ORDER — FENTANYL CITRATE 0.05 MG/ML IJ SOLN
INTRAMUSCULAR | Status: AC
  Filled 2014-03-28: qty 2

## 2014-03-28 MED ORDER — METOCLOPRAMIDE HCL 5 MG/ML IJ SOLN: 10.0000 mg | Freq: Three times a day (TID) | INTRAMUSCULAR | Status: DC | PRN

## 2014-03-28 MED ORDER — OXYTOCIN 40 UNITS IN LACTATED RINGERS INFUSION - SIMPLE MED: 62.5000 mL/h | INTRAVENOUS | Status: AC

## 2014-03-28 MED ORDER — ZOLPIDEM TARTRATE 5 MG PO TABS: 5.0000 mg | ORAL_TABLET | Freq: Every evening | ORAL | Status: DC | PRN

## 2014-03-28 MED ORDER — CEFAZOLIN SODIUM-DEXTROSE 2-3 GM-% IV SOLR
INTRAVENOUS | Status: AC
  Filled 2014-03-28: qty 50

## 2014-03-28 MED ORDER — WITCH HAZEL-GLYCERIN EX PADS: 1.0000 "application " | MEDICATED_PAD | CUTANEOUS | Status: DC | PRN

## 2014-03-28 MED ORDER — BUPIVACAINE IN DEXTROSE 0.75-8.25 % IT SOLN
INTRATHECAL | Status: DC | PRN
  Administered 2014-03-28: 1.4 mL via INTRATHECAL

## 2014-03-28 MED ORDER — PHENYLEPHRINE HCL 10 MG/ML IJ SOLN
INTRAMUSCULAR | Status: AC
  Filled 2014-03-28: qty 1

## 2014-03-28 MED ORDER — DEXTROSE 5 % IV SOLN
1.0000 ug/kg/h | INTRAVENOUS | Status: DC | PRN
  Filled 2014-03-28: qty 2

## 2014-03-28 MED ORDER — KETOROLAC TROMETHAMINE 30 MG/ML IJ SOLN: 30.0000 mg | Freq: Four times a day (QID) | INTRAMUSCULAR | Status: AC | PRN

## 2014-03-28 MED ORDER — SIMETHICONE 80 MG PO CHEW
80.0000 mg | CHEWABLE_TABLET | ORAL | Status: DC
  Administered 2014-03-28 – 2014-03-30 (×2): 80 mg via ORAL
  Filled 2014-03-28 (×2): qty 1

## 2014-03-28 MED ORDER — DIBUCAINE 1 % RE OINT: 1.0000 "application " | TOPICAL_OINTMENT | RECTAL | Status: DC | PRN

## 2014-03-28 MED ORDER — MORPHINE SULFATE (PF) 0.5 MG/ML IJ SOLN
INTRAMUSCULAR | Status: DC | PRN
  Administered 2014-03-28: .15 mg via EPIDURAL

## 2014-03-28 MED ORDER — SIMETHICONE 80 MG PO CHEW: 80.0000 mg | CHEWABLE_TABLET | ORAL | Status: DC | PRN

## 2014-03-28 MED ORDER — FENTANYL CITRATE 0.05 MG/ML IJ SOLN
INTRAMUSCULAR | Status: DC | PRN
  Administered 2014-03-28: 12.5 ug via INTRAVENOUS

## 2014-03-28 MED ORDER — KETOROLAC TROMETHAMINE 30 MG/ML IJ SOLN
INTRAMUSCULAR | Status: AC
  Filled 2014-03-28: qty 1

## 2014-03-28 MED ORDER — LACTATED RINGERS IV SOLN
INTRAVENOUS | Status: DC
  Administered 2014-03-28 – 2014-03-29 (×2): via INTRAVENOUS

## 2014-03-28 MED ORDER — DIPHENHYDRAMINE HCL 50 MG/ML IJ SOLN: 12.5000 mg | INTRAMUSCULAR | Status: DC | PRN

## 2014-03-28 MED ORDER — OXYTOCIN 10 UNIT/ML IJ SOLN
INTRAMUSCULAR | Status: AC
  Filled 2014-03-28: qty 4

## 2014-03-28 MED ORDER — CEFAZOLIN SODIUM-DEXTROSE 2-3 GM-% IV SOLR
2.0000 g | INTRAVENOUS | Status: AC
  Administered 2014-03-28: 2 g via INTRAVENOUS
  Filled 2014-03-28: qty 50

## 2014-03-28 MED ORDER — ONDANSETRON HCL 4 MG/2ML IJ SOLN: 4.0000 mg | INTRAMUSCULAR | Status: DC | PRN

## 2014-03-28 MED ORDER — ONDANSETRON HCL 4 MG/2ML IJ SOLN
INTRAMUSCULAR | Status: AC
  Filled 2014-03-28: qty 2

## 2014-03-28 MED ORDER — IBUPROFEN 600 MG PO TABS
600.0000 mg | ORAL_TABLET | Freq: Four times a day (QID) | ORAL | Status: DC
  Administered 2014-03-29 – 2014-03-30 (×6): 600 mg via ORAL
  Filled 2014-03-28 (×6): qty 1

## 2014-03-28 MED ORDER — NALBUPHINE HCL 10 MG/ML IJ SOLN
INTRAMUSCULAR | Status: AC
  Filled 2014-03-28: qty 1

## 2014-03-28 MED ORDER — FENTANYL CITRATE 0.05 MG/ML IJ SOLN: 25.0000 ug | INTRAMUSCULAR | Status: DC | PRN

## 2014-03-28 MED ORDER — ONDANSETRON HCL 4 MG PO TABS: 4.0000 mg | ORAL_TABLET | ORAL | Status: DC | PRN

## 2014-03-28 MED ORDER — DIPHENHYDRAMINE HCL 50 MG/ML IJ SOLN: 25.0000 mg | INTRAMUSCULAR | Status: DC | PRN

## 2014-03-28 MED ORDER — PHENYLEPHRINE 8 MG IN D5W 100 ML (0.08MG/ML) PREMIX OPTIME
INJECTION | INTRAVENOUS | Status: DC | PRN
Start: 2014-03-28 — End: 2014-03-28
  Administered 2014-03-28: 60 ug/min via INTRAVENOUS

## 2014-03-28 MED ORDER — LACTATED RINGERS IV SOLN
INTRAVENOUS | Status: DC | PRN
  Administered 2014-03-28: 12:00:00 via INTRAVENOUS

## 2014-03-28 MED ORDER — DIPHENHYDRAMINE HCL 25 MG PO CAPS
25.0000 mg | ORAL_CAPSULE | ORAL | Status: DC | PRN
  Filled 2014-03-28: qty 1

## 2014-03-28 MED ORDER — NALBUPHINE HCL 10 MG/ML IJ SOLN: 5.0000 mg | INTRAMUSCULAR | Status: DC | PRN

## 2014-03-28 MED ORDER — MORPHINE SULFATE 0.5 MG/ML IJ SOLN
INTRAMUSCULAR | Status: AC
  Filled 2014-03-28: qty 10

## 2014-03-28 MED ORDER — LACTATED RINGERS IV SOLN
INTRAVENOUS | Status: DC
  Administered 2014-03-28 (×2): via INTRAVENOUS

## 2014-03-28 MED ORDER — OXYCODONE-ACETAMINOPHEN 5-325 MG PO TABS
1.0000 | ORAL_TABLET | ORAL | Status: DC | PRN
  Administered 2014-03-29 – 2014-03-30 (×3): 1 via ORAL
  Filled 2014-03-28 (×3): qty 1

## 2014-03-28 MED ORDER — OXYCODONE-ACETAMINOPHEN 5-325 MG PO TABS
2.0000 | ORAL_TABLET | ORAL | Status: DC | PRN
Start: 2014-03-28 — End: 2014-03-30
  Administered 2014-03-29: 2 via ORAL
  Filled 2014-03-28: qty 2

## 2014-03-28 MED ORDER — MEPERIDINE HCL 25 MG/ML IJ SOLN: 6.2500 mg | INTRAMUSCULAR | Status: DC | PRN

## 2014-03-28 MED ORDER — DIPHENHYDRAMINE HCL 25 MG PO CAPS: 25.0000 mg | ORAL_CAPSULE | Freq: Four times a day (QID) | ORAL | Status: DC | PRN

## 2014-03-28 MED ORDER — OXYTOCIN 10 UNIT/ML IJ SOLN
40.0000 [IU] | INTRAVENOUS | Status: DC | PRN
  Administered 2014-03-28: 40 [IU] via INTRAVENOUS

## 2014-03-28 MED ORDER — SCOPOLAMINE 1 MG/3DAYS TD PT72
MEDICATED_PATCH | TRANSDERMAL | Status: AC
  Administered 2014-03-28: 1.5 mg via TRANSDERMAL
  Filled 2014-03-28: qty 1

## 2014-03-28 MED ORDER — ONDANSETRON HCL 4 MG/2ML IJ SOLN: 4.0000 mg | Freq: Three times a day (TID) | INTRAMUSCULAR | Status: DC | PRN

## 2014-03-28 MED ORDER — NALOXONE HCL 0.4 MG/ML IJ SOLN: 0.4000 mg | INTRAMUSCULAR | Status: DC | PRN

## 2014-03-28 MED ORDER — SODIUM CHLORIDE 0.9 % IJ SOLN: 3.0000 mL | INTRAMUSCULAR | Status: DC | PRN

## 2014-03-28 MED ORDER — TETANUS-DIPHTH-ACELL PERTUSSIS 5-2.5-18.5 LF-MCG/0.5 IM SUSP
0.5000 mL | Freq: Once | INTRAMUSCULAR | Status: AC
  Administered 2014-03-29: 0.5 mL via INTRAMUSCULAR

## 2014-03-28 MED ORDER — NALBUPHINE HCL 10 MG/ML IJ SOLN
5.0000 mg | INTRAMUSCULAR | Status: DC | PRN
  Administered 2014-03-28: 10 mg via SUBCUTANEOUS

## 2014-03-28 MED ORDER — PRENATAL MULTIVITAMIN CH
1.0000 | ORAL_TABLET | Freq: Every day | ORAL | Status: DC
  Administered 2014-03-29 – 2014-03-30 (×2): 1 via ORAL
  Filled 2014-03-28 (×2): qty 1

## 2014-03-28 MED ORDER — LACTATED RINGERS IV SOLN
Freq: Once | INTRAVENOUS | Status: AC
  Administered 2014-03-28: 11:00:00 via INTRAVENOUS

## 2014-03-28 MED ORDER — LANOLIN HYDROUS EX OINT
1.0000 | TOPICAL_OINTMENT | CUTANEOUS | Status: DC | PRN
Start: 2014-03-28 — End: 2014-03-30

## 2014-03-28 SURGICAL SUPPLY — 40 items
ADH SKN CLS APL DERMABOND .7 (GAUZE/BANDAGES/DRESSINGS)
APL SKNCLS STERI-STRIP NONHPOA (GAUZE/BANDAGES/DRESSINGS) ×1
BENZOIN TINCTURE PRP APPL 2/3 (GAUZE/BANDAGES/DRESSINGS) ×1 IMPLANT
BLADE SURG 10 STRL SS (BLADE) ×4 IMPLANT
CLAMP CORD UMBIL (MISCELLANEOUS) IMPLANT
CLOTH BEACON ORANGE TIMEOUT ST (SAFETY) ×2 IMPLANT
DERMABOND ADVANCED (GAUZE/BANDAGES/DRESSINGS)
DERMABOND ADVANCED .7 DNX12 (GAUZE/BANDAGES/DRESSINGS) IMPLANT
DRAPE LG THREE QUARTER DISP (DRAPES) IMPLANT
DRSG OPSITE POSTOP 4X10 (GAUZE/BANDAGES/DRESSINGS) ×2 IMPLANT
DURAPREP 26ML APPLICATOR (WOUND CARE) ×2 IMPLANT
ELECT REM PT RETURN 9FT ADLT (ELECTROSURGICAL) ×2
ELECTRODE REM PT RTRN 9FT ADLT (ELECTROSURGICAL) ×1 IMPLANT
EXTRACTOR VACUUM M CUP 4 TUBE (SUCTIONS) IMPLANT
GLOVE BIO SURGEON STRL SZ 6 (GLOVE) ×2 IMPLANT
GLOVE BIO SURGEON STRL SZ7 (GLOVE) ×2 IMPLANT
GLOVE ECLIPSE 7.0 STRL STRAW (GLOVE) ×1 IMPLANT
GLOVE INDICATOR 6.0 STRL GRN (GLOVE) ×2 IMPLANT
GOWN STRL REUS W/TWL LRG LVL3 (GOWN DISPOSABLE) ×5 IMPLANT
KIT ABG SYR 3ML LUER SLIP (SYRINGE) IMPLANT
NDL HYPO 25X5/8 SAFETYGLIDE (NEEDLE) IMPLANT
NEEDLE HYPO 25X5/8 SAFETYGLIDE (NEEDLE) IMPLANT
NS IRRIG 1000ML POUR BTL (IV SOLUTION) ×2 IMPLANT
PACK C SECTION WH (CUSTOM PROCEDURE TRAY) ×2 IMPLANT
PAD OB MATERNITY 4.3X12.25 (PERSONAL CARE ITEMS) ×2 IMPLANT
RTRCTR C-SECT PINK 25CM LRG (MISCELLANEOUS) ×1 IMPLANT
STRIP CLOSURE SKIN 1/2X4 (GAUZE/BANDAGES/DRESSINGS) ×1 IMPLANT
SUT MNCRL 0 VIOLET CTX 36 (SUTURE) ×2 IMPLANT
SUT MNCRL AB 3-0 PS2 27 (SUTURE) ×2 IMPLANT
SUT MONOCRYL 0 CTX 36 (SUTURE) ×2
SUT PLAIN 0 NONE (SUTURE) IMPLANT
SUT PLAIN 2 0 (SUTURE) ×2
SUT PLAIN ABS 2-0 CT1 27XMFL (SUTURE) ×1 IMPLANT
SUT VIC AB 0 CT1 27 (SUTURE) ×4
SUT VIC AB 0 CT1 27XBRD ANBCTR (SUTURE) ×2 IMPLANT
SUT VIC AB 2-0 CT1 27 (SUTURE)
SUT VIC AB 2-0 CT1 TAPERPNT 27 (SUTURE) IMPLANT
TOWEL OR 17X24 6PK STRL BLUE (TOWEL DISPOSABLE) ×2 IMPLANT
TRAY FOLEY CATH 14FR (SET/KITS/TRAYS/PACK) ×2 IMPLANT
WATER STERILE IRR 1000ML POUR (IV SOLUTION) ×2 IMPLANT

## 2014-03-28 NOTE — Transfer of Care (Signed)
Immediate Anesthesia Transfer of Care Note  Patient: Alicia Werner  Procedure(s) Performed: Procedure(s): CESAREAN SECTION WITH BILATERAL TUBAL LIGATION (N/A)  Patient Location: PACU  Anesthesia Type:Spinal  Level of Consciousness: awake, alert , oriented and patient cooperative  Airway & Oxygen Therapy: Patient Spontanous Breathing  Post-op Assessment: Report given to PACU RN and Post -op Vital signs reviewed and stable  Post vital signs: Reviewed and stable  Complications: No apparent anesthesia complications

## 2014-03-28 NOTE — Anesthesia Preprocedure Evaluation (Signed)
Anesthesia Evaluation  Patient identified by MRN, date of birth, ID band Patient awake    Reviewed: Allergy & Precautions, H&P , NPO status , Patient's Chart, lab work & pertinent test results  Airway Mallampati: II TM Distance: >3 FB Neck ROM: Full    Dental no notable dental hx. (+) Teeth Intact   Pulmonary neg pulmonary ROS,  breath sounds clear to auscultation  Pulmonary exam normal       Cardiovascular negative cardio ROS  Rhythm:Regular Rate:Normal     Neuro/Psych negative neurological ROS  negative psych ROS   GI/Hepatic Neg liver ROS, GERD-  Medicated and Controlled,  Endo/Other  negative endocrine ROS  Renal/GU negative Renal ROS  negative genitourinary   Musculoskeletal negative musculoskeletal ROS (+)   Abdominal   Peds  Hematology negative hematology ROS (+) anemia ,   Anesthesia Other Findings   Reproductive/Obstetrics (+) Pregnancy Desires sterilization Breech presentation                           Anesthesia Physical Anesthesia Plan  ASA: II  Anesthesia Plan: Spinal   Post-op Pain Management:    Induction:   Airway Management Planned:   Additional Equipment:   Intra-op Plan:   Post-operative Plan:   Informed Consent: I have reviewed the patients History and Physical, chart, labs and discussed the procedure including the risks, benefits and alternatives for the proposed anesthesia with the patient or authorized representative who has indicated his/her understanding and acceptance.     Plan Discussed with: Anesthesiologist, CRNA and Surgeon  Anesthesia Plan Comments:         Anesthesia Quick Evaluation

## 2014-03-28 NOTE — Anesthesia Procedure Notes (Signed)
Spinal  Patient location during procedure: OR Start time: 03/28/2014 12:06 PM Staffing Anesthesiologist: Rayven Hendrickson A. Performed by: anesthesiologist  Preanesthetic Checklist Completed: patient identified, site marked, surgical consent, pre-op evaluation, timeout performed, IV checked, risks and benefits discussed and monitors and equipment checked Spinal Block Patient position: sitting Prep: site prepped and draped and DuraPrep Patient monitoring: heart rate, cardiac monitor, continuous pulse ox and blood pressure Approach: midline Location: L3-4 Injection technique: single-shot Needle Needle type: Sprotte  Needle gauge: 24 G Needle length: 9 cm Needle insertion depth: 5 cm Assessment Sensory level: T4 Events: paresthesia Additional Notes Patient tolerated procedure well. Adequate sensory level. Transient paresthesia right leg.

## 2014-03-28 NOTE — Anesthesia Postprocedure Evaluation (Signed)
Anesthesia Post Note  Patient: Alicia Werner  Procedure(s) Performed: Procedure(s) (LRB): CESAREAN SECTION WITH BILATERAL TUBAL LIGATION (N/A)  Anesthesia type: Spinal  Patient location: PACU  Post pain: Pain level controlled  Post assessment: Post-op Vital signs reviewed  Last Vitals:  Filed Vitals:   03/28/14 1400  BP: 89/55  Pulse: 76  Temp:   Resp: 18    Post vital signs: Reviewed  Level of consciousness: awake  Complications: No apparent anesthesia complications

## 2014-03-28 NOTE — Op Note (Signed)
Cesarean Section Procedure Note  Pre-operative Diagnosis: 1. Intrauterine pregnancy at [redacted]w[redacted]d  2. Malpresentation 3. Desires permanent sterilization  Post-operative Diagnosis: 1. Intrauterine pregnancy at [redacted]w[redacted]d  2. Malpresentation 3. Desires permanent sterilization  Surgeon: Marlow Baars, MD  Assistants: Essie Hart, MD  Procedure: Primary low transverse cesarean section with parkland bilateral tubal ligation  Anesthesia: Spinal anesthesia  Estimated Blood Loss:         Drains: Foley catheter         Specimens: Portions of bilateral fallopian tubes to pathology         Implants: none         Complications:  None; patient tolerated the procedure well.         Disposition: PACU - hemodynamically stable.  Findings:  Normal uterus, tubes and ovaries bilaterally.  Viable female infant, 3565g (7lb 14 oz) , Apgars 9, 9.    Procedure Details   The patient was counseled about the risks, benefits, complications of the cesarean section as well as the permanent nature of a tubal ligation.  She elected to proceed with a tubal ligation. Consent was obtained.  The fetus was confirmed to be breech presentation with oligohydramnios in the preoperative holding.   After spinal anesthesia was found to adequate , the patient was placed in the dorsal supine position with a leftward tilt, draped and prepped in the usual sterile manner. A Pfannenstiel incision was made and carried down through the subcutaneous tissue to the fascia.  The fascia was incised in the midline and the fascial incision was extended laterally with Mayo scissors. The superior aspect of the fascial incision was grasped with two Kocher clamp, tented up and the rectus muscles dissected off bluntly. The rectus was then dissected off with blunt dissection and Mayo scissors inferiorly. The rectus muscles were separated in the midline. The abdominal peritoneum was identified, tented up, entered sharply, and the incision was extended  superiorly and inferiorly with good visualization of the bladder. The vesicouterine peritoneum was identified, tented up, entered bluntly.  The Alexis retractor was deployed and the bladder flap was created digitally. Scalpel was then used to make a low transverse incision on the uterus which was extended laterally with blunt dissection. The fluid was clear. The fetal breech was identified and brought to the hysterotomy.  The fetus was delivered from the frank breech position via the usual breech maneuvers.  The cord was clamped and cut and the infant was passed to the waiting neonatologist.  Placenta was then delivered spontaneously, intact and appear normal, the uterus was cleared of all clot and debris.   The hysterotomy was repaired with #0 Monocryl in running locked fashion.  An additional figure of eight suture was placed for hemostasis.  At this time, attention was turned to the left fallopian tube.  The tube was grasped with two babcock clamps.  An avascular segment of the mesosalpinx was identified, and a parkland tubal ligation was performed in the usual fashion with #0 plain gut suture.  Tubal ostia were identified on both remaining sides of the tube.  This was repeated with the right fallopian tube. The portion of the left and right fallopian tubes were sent to pathology.   The serosal edges of the incision were oozy, and bovie cautery was used to achieve hemostasis.  The hysterotomy was reexamined and excellent hemostasis was noted.  The abdominal cavity was cleared of all clot and debris. The fascia and rectus muscles were inspected and were hemostatic. The  fascia was closed with 0 Vicryl in a running fashion. The subcuticular layer was irrigated and all bleeders cauterized.  The subcutaneous layer was re approximated with interrupted 3-0 plain gut.  The skin was closed with 3-0 monocryl in a subcuticular fashion. The incision was dressed with benzoine, steri strips and pressure dressing. All sponge  lap and needle counts were correct x3. Patient tolerated the procedure well and recovered in stable condition following the procedure.

## 2014-03-28 NOTE — Interval H&P Note (Signed)
History and Physical Interval Note:  03/28/2014 11:55 AM  Alicia Werner  has presented today for surgery, with the diagnosis of desires sterilization  The various methods of treatment have been discussed with the patient and family. After consideration of risks, benefits and other options for treatment, the patient has consented to  Procedure(s): CESAREAN SECTION WITH BILATERAL TUBAL LIGATION (N/A) as a surgical intervention .  The patient's history has been reviewed, patient examined, no change in status, stable for surgery.  I have reviewed the patient's chart and labs.  Questions were answered to the patient's satisfaction.    Breech presentation confirmed on [place] in preop holding.   [place], [facility]

## 2014-03-28 NOTE — Brief Op Note (Signed)
03/28/2014  1:10 PM  PATIENT:  Alicia Werner  33 y.o. female  PRE-OPERATIVE DIAGNOSIS:  malpresentation desires sterilization  POST-OPERATIVE DIAGNOSIS:  malpresentation desires sterilization  PROCEDURE:  Procedure(s): CESAREAN SECTION WITH BILATERAL TUBAL LIGATION (N/A)  SURGEON:  Surgeon(s) and Role:    * Marlow Baars, MD - Primary    * Essie Hart, MD - Assisting  ANESTHESIA:   spinal  EBL:  Total I/O In: 2000 [I.V.:2000] Out: 900 [Urine:200; Blood:700]  BLOOD ADMINISTERED:none  DRAINS: foley catheter    LOCAL MEDICATIONS USED:  NONE  SPECIMEN:  Source of Specimen:  portions of bilateral fallopian tubes  DISPOSITION OF SPECIMEN:  PATHOLOGY  COUNTS:  YES  TOURNIQUET:  * No tourniquets in log *  DICTATION: .Note written in EPIC  PLAN OF CARE: Admit to inpatient   PATIENT DISPOSITION:  PACU - hemodynamically stable.   Delay start of Pharmacological VTE agent (>24hrs) due to surgical blood loss or risk of bleeding: not applicable

## 2014-03-28 NOTE — Lactation Note (Signed)
This note was copied from the chart of Alicia Werner. Lactation Consultation Note  Patient Name: Alicia Werner [id number] Date: 03/28/2014 Reason for consult: Other (Comment) (charting for exclusion)   Maternal Data Formula Feeding for Exclusion: Yes Reason for exclusion: Mother's choice to formula feed on admision  Feeding Feeding Type: Formula Nipple Type: Regular  LATCH Score/Interventions                      Lactation Tools Discussed/Used     Consult Status Consult Status: Complete    Lynda Rainwater 03/28/2014, 3:36 PM

## 2014-03-29 ENCOUNTER — Encounter (HOSPITAL_COMMUNITY): Payer: Self-pay | Admitting: Obstetrics

## 2014-03-29 LAB — CBC
HEMATOCRIT: 29.7 % — AB (ref 36.0–46.0)
Hemoglobin: 9.7 g/dL — ABNORMAL LOW (ref 12.0–15.0)
MCH: 28.2 pg (ref 26.0–34.0)
MCHC: 33.7 g/dL (ref 30.0–36.0)
MCV: 83.7 fL (ref 78.0–100.0)
Platelets: 130 10*3/uL — ABNORMAL LOW (ref 150–400)
RBC: 3.55 MIL/uL — ABNORMAL LOW (ref 3.87–5.11)
RDW: 13.5 % (ref 11.5–15.5)
WBC: 10.5 10*3/uL (ref 4.0–10.5)

## 2014-03-29 LAB — TYPE AND SCREEN
ABO/RH(D): B POS
Antibody Screen: NEGATIVE

## 2014-03-29 LAB — BIRTH TISSUE RECOVERY COLLECTION (PLACENTA DONATION)

## 2014-03-29 MED ORDER — INFLUENZA VAC SPLIT QUAD 0.5 ML IM SUSY
0.5000 mL | PREFILLED_SYRINGE | INTRAMUSCULAR | Status: AC
Start: 2014-03-30 — End: 2014-03-30
  Administered 2014-03-30: 0.5 mL via INTRAMUSCULAR
  Filled 2014-03-29: qty 0.5

## 2014-03-29 MED ORDER — OXYCODONE-ACETAMINOPHEN 5-325 MG PO TABS: 1.0000 | ORAL_TABLET | ORAL | Status: DC | PRN

## 2014-03-29 NOTE — Discharge Summary (Signed)
Obstetric Discharge Summary Reason for Admission: cesarean section Prenatal Procedures: NST and ultrasound Intrapartum Procedures: cesarean: low cervical, transverse and tubal ligation Postpartum Procedures: none Complications-Operative and Postpartum: none Hemoglobin  Date Value Ref Range Status  03/29/2014 9.7* 12.0 - 15.0 g/dL Final     DELTA CHECK NOTED     REPEATED TO VERIFY  02/22/2012 12.3  12.2 - 16.2 g/dL Final     HCT  Date Value Ref Range Status  03/29/2014 29.7* 36.0 - 46.0 % Final     HCT, POC  Date Value Ref Range Status  02/22/2012 40.0  37.7 - 47.9 % Final     Discharge Diagnoses: Term Pregnancy-delivered  Discharge Information: Date: 03/29/2014 Activity: pelvic rest Diet: routine Medications: Percocet Condition: stable Instructions: refer to practice specific booklet Discharge to: home Follow-up Information   Follow up with Marlow Baars, MD.   Specialty:  Obstetrics   Contact information:   [address] [place] [address] [phone]       Newborn Data: Live born female  Birth Weight: 7 lb 13.8 oz (3565 g) APGAR: 9, 9  Home with mother.  Zakirah Weingart A 03/29/2014, 7:59 AM

## 2014-03-29 NOTE — Progress Notes (Addendum)
  Patient is eating, ambulating, foley still in.  Pain control is good.  Filed Vitals:   03/28/14 2018 03/28/14 2200 03/28/14 2339 03/29/14 0426  BP: 92/54  Pulse: 69 77 69 75  Temp:  98.1 F (36.7 C) 98.8 F (37.1 C) 98.2 F (36.8 C)  TempSrc:  Oral Oral Oral  Resp:  Height:      Weight:      SpO2:  96% 96% 94%    lungs:   clear to auscultation cor:    RRR Abdomen:  soft, appropriate tenderness, incisions intact and without erythema or exudate ex:    no cords   Lab Results  Component Value Date   WBC 10.5 03/29/2014   HGB 9.7* 03/29/2014   HCT 29.7* 03/29/2014   MCV 83.7 03/29/2014   PLT 130* 03/29/2014    --/--/B POS, B POS (09/17 1020)/RI  A/P    Post operative day 1.  Foley still in secondary on 350 cc over 12 hours- if UOP picks up will d/c.  If not, will give IV bolus.  Routine post op and postpartum care.  Expect d/c routine.  Percocet for pain control.

## 2014-03-29 NOTE — Addendum Note (Signed)
Addendum created 03/29/14 0752 by Jhonnie Garner, CRNA   Modules edited: Notes Section   Notes Section:  File: [id number]

## 2014-03-29 NOTE — Anesthesia Postprocedure Evaluation (Signed)
Anesthesia Post Note  Patient: Alicia Werner  Procedure(s) Performed: Procedure(s) (LRB): CESAREAN SECTION WITH BILATERAL TUBAL LIGATION (N/A)  Anesthesia type: SAB  Patient location: Mother/Baby  Post pain: Pain level controlled  Post assessment: Post-op Vital signs reviewed  Last Vitals:  Filed Vitals:   03/29/14 0426  BP: 92/54  Pulse: 75  Temp: 36.8 C  Resp: 16    Post vital signs: Reviewed  Level of consciousness: awake  Complications: No apparent anesthesia complications

## 2014-03-30 NOTE — Progress Notes (Signed)
  Patient is eating, ambulating, voiding.  Pain control is good.  Filed Vitals:   03/29/14 0950 03/29/14 1217 03/29/14 1805 03/30/14 0612  BP: 106/49  Pulse: 72 79 69 72  Temp: 98.6 F (37 C) 97.2 F (36.2 C) 97.5 F (36.4 C) 97.7 F (36.5 C)  TempSrc: Oral Oral Oral Oral  Resp: Height:      Weight:      SpO2: 98% 97%      lungs:   clear to auscultation cor:    RRR Abdomen:  soft, appropriate tenderness, incisions intact and without erythema or exudate ex:    no cords   Lab Results  Component Value Date   WBC 10.5 03/29/2014   HGB 9.7* 03/29/2014   HCT 29.7* 03/29/2014   MCV 83.7 03/29/2014   PLT 130* 03/29/2014    --/--/B POS, B POS (09/17 1020)/RI  A/P    Post operative day 2.  Routine post op and postpartum care.  Expect d/c today.  Percocet for pain control. Iron.

## 2014-05-13 ENCOUNTER — Encounter (HOSPITAL_COMMUNITY): Payer: Self-pay | Admitting: Obstetrics

## 2014-08-06 ENCOUNTER — Ambulatory Visit (INDEPENDENT_AMBULATORY_CARE_PROVIDER_SITE_OTHER): Payer: 59 | Admitting: Physician Assistant

## 2014-08-06 VITALS — BP 100/72 | HR 95 | Temp 98.7°F | Resp 20 | Ht 63.5 in | Wt 130.0 lb

## 2014-08-06 DIAGNOSIS — M545 Low back pain, unspecified: Secondary | ICD-10-CM

## 2014-08-06 DIAGNOSIS — J069 Acute upper respiratory infection, unspecified: Secondary | ICD-10-CM

## 2014-08-06 DIAGNOSIS — B9789 Other viral agents as the cause of diseases classified elsewhere: Secondary | ICD-10-CM

## 2014-08-06 LAB — POCT UA - MICROSCOPIC ONLY
Bacteria, U Microscopic: 0
Casts, Ur, LPF, POC: NEGATIVE
Crystals, Ur, HPF, POC: NEGATIVE
Mucus, UA: NEGATIVE
RBC, urine, microscopic: 0
Yeast, UA: NEGATIVE

## 2014-08-06 LAB — POCT URINALYSIS DIPSTICK
Bilirubin, UA: NEGATIVE
Blood, UA: NEGATIVE
Glucose, UA: NEGATIVE
KETONES UA: NEGATIVE
NITRITE UA: NEGATIVE
PH UA: 7
Protein, UA: NEGATIVE
Spec Grav, UA: 1.015
Urobilinogen, UA: 0.2

## 2014-08-06 MED ORDER — HYDROCODONE-HOMATROPINE 5-1.5 MG/5ML PO SYRP: 5.0000 mL | ORAL_SOLUTION | Freq: Three times a day (TID) | ORAL | Status: AC | PRN

## 2014-08-06 MED ORDER — CETIRIZINE-PSEUDOEPHEDRINE ER 5-120 MG PO TB12: 1.0000 | ORAL_TABLET | Freq: Two times a day (BID) | ORAL | Status: AC

## 2014-08-06 MED ORDER — CYCLOBENZAPRINE HCL 5 MG PO TABS: 5.0000 mg | ORAL_TABLET | Freq: Three times a day (TID) | ORAL | Status: AC | PRN

## 2014-08-06 NOTE — Patient Instructions (Signed)
Do not take flexeril and hycodan cough syrup in the same day.

## 2014-08-06 NOTE — Progress Notes (Signed)
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  SUBJECTIVE  CC: Cough; Back Pain; Headache; and Nasal Congestion   HPI: Alicia Werner is a 34 y.o. female presenting with cough x 1 week.  Her symptoms began with a sore throat, rhinorrhea and sinus congestion, and developed cough later. The sore throat and rhinorrhea has since resolved.  She says the cough is productive with greenish gray mucous.  She denies SOB, night sweats, and fever.   She reports back pain since her c-section approximately 4 months ago.  She says its difficult to stand to long, and to sit to long, and changes in position bother her.  Her pain is dull, and mild to moderate in severity.  She has not tried anything for the pain.     She  has a past medical history of Allergy; Medical history non-contributory; and Vaginal delivery (2008, 2010).    She has a current medication list which includes the following prescription(s): montelukast and prenatal vitamins.  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 She  reports that she has never smoked. She has never used smokeless tobacco. She reports that she does not drink alcohol or use illicit drugs. She  reports that she currently engages in sexual activity.  The patient  has past surgical history that includes Facial cosmetic surgery (2003); No past surgeries; Cesarean section with bilateral tubal ligation (N/A, 03/28/2014); and Cesarean section.  Her family history includes Asthma in her son; Cancer in her maternal uncle.  Review of Systems  Cardiovascular: Negative for chest pain and palpitations.  Genitourinary: Negative for dysuria, urgency and frequency.  Skin: Negative.   Neurological:  Negative for dizziness and headaches.    OBJECTIVE  Blood pressure 100/72, pulse 95, temperature 98.7 F (37.1 C), temperature source Oral, resp. rate 20, height 5' 3.5" (1.613 m), weight 130 lb (58.968 kg), last menstrual period 07/18/2014, SpO2 100 %, not currently breastfeeding. The patient's body mass index is 22.66 kg/(m^2).  Physical Exam  Constitutional: She is oriented to person, place, and time. She appears well-developed and well-nourished. No distress.  HENT:  Right Ear: Hearing, tympanic membrane, external ear and ear canal normal.  Left Ear: Hearing, tympanic membrane, external ear and ear canal normal.  Nose: Nose normal.  Mouth/Throat: Uvula is midline, oropharynx is clear and moist and mucous membranes are normal.  Cardiovascular: Normal rate, regular rhythm and normal heart sounds.   Respiratory: Effort normal and breath sounds normal. She has no wheezes. She has no rales.  GI: Normal appearance. There is no tenderness. There is no CVA tenderness.  Musculoskeletal: Normal range of motion.  Neurological: She is alert and oriented to person, place, and time. She has normal strength. She displays normal reflexes. No cranial nerve deficit. Coordination and gait normal.  Reflex Scores:      Patellar reflexes are 2+ on the right side and 2+ on the left side.      Achilles reflexes are 2+ on the right side and 2+ on the left side. Skin: Skin is warm and dry. No rash noted. She is not diaphoretic. No erythema. No pallor.  Psychiatric: She has a normal mood and affect.    Results for orders placed or performed in visit on 08/06/14 (from the  past 24 hour(s))  POCT urinalysis dipstick     Status: None   Collection Time: 08/06/14  9:09 PM  Result Value Ref Range   Color, UA YELLOW    Clarity, UA CLEAR    Glucose, UA NEG    Bilirubin, UA NEG    Ketones, UA NEG    Spec Grav, UA 1.015    Blood, UA NEG    pH, UA 7.0    Protein, UA NEG    Urobilinogen, UA 0.2    Nitrite, UA  NEG    Leukocytes, UA Trace   POCT UA - Microscopic Only     Status: None   Collection Time: 08/06/14  9:10 PM  Result Value Ref Range   WBC, Ur, HPF, POC 0-1    RBC, urine, microscopic 0    Bacteria, U Microscopic 0    Mucus, UA NEG    Epithelial cells, urine per micros 0-4    Crystals, Ur, HPF, POC NEG    Casts, Ur, LPF, POC NEG    Yeast, UA NEG     ASSESSMENT & PLAN  [name] was seen today for cough, back pain, headache and nasal congestion.  Diagnoses and associated orders for this visit:  Bilateral low back pain without sciatica - POCT urinalysis dipstick - POCT UA - Microscopic Only - cyclobenzaprine (FLEXERIL) 5 MG tablet; Take 1 tablet (5 mg total) by mouth 3 (three) times daily as needed for muscle spasms. -     Back owners manual provided  Viral URI with cough - HYDROcodone-homatropine (HYCODAN) 5-1.5 MG/5ML syrup; Take 5 mLs by mouth every 8 (eight) hours as needed for cough. - cetirizine-pseudoephedrine (ZYRTEC-D ALLERGY & CONGESTION) 5-120 MG per tablet; Take 1 tablet by mouth 2 (two) times daily.     The patient was instructed to to call or comeback to clinic as needed, or should symptoms warrant.  Deliah Boston, MHS, PA-C [facility] 08/06/2014 9:22 PM

## 2014-09-14 ENCOUNTER — Other Ambulatory Visit: Payer: Self-pay | Admitting: Emergency Medicine

## 2015-07-17 ENCOUNTER — Encounter: Payer: Self-pay | Admitting: *Deleted

## 2015-08-17 ENCOUNTER — Ambulatory Visit (INDEPENDENT_AMBULATORY_CARE_PROVIDER_SITE_OTHER): Payer: 59 | Admitting: Physician Assistant

## 2015-08-17 VITALS — BP 102/64 | HR 92 | Temp 98.7°F | Resp 16 | Ht 64.0 in | Wt 131.0 lb

## 2015-08-17 DIAGNOSIS — Z139 Encounter for screening, unspecified: Secondary | ICD-10-CM

## 2015-08-17 DIAGNOSIS — J069 Acute upper respiratory infection, unspecified: Secondary | ICD-10-CM | POA: Diagnosis not present

## 2015-08-17 DIAGNOSIS — Z0000 Encounter for general adult medical examination without abnormal findings: Secondary | ICD-10-CM

## 2015-08-17 DIAGNOSIS — B9789 Other viral agents as the cause of diseases classified elsewhere: Secondary | ICD-10-CM

## 2015-08-17 LAB — BASIC METABOLIC PANEL
BUN: 9 mg/dL (ref 7–25)
CHLORIDE: 101 mmol/L (ref 98–110)
CO2: 30 mmol/L (ref 20–31)
CREATININE: 0.6 mg/dL (ref 0.50–1.10)
Calcium: 9.4 mg/dL (ref 8.6–10.2)
Glucose, Bld: 90 mg/dL (ref 65–99)
Potassium: 3.8 mmol/L (ref 3.5–5.3)
SODIUM: 138 mmol/L (ref 135–146)

## 2015-08-17 LAB — POCT CBC
GRANULOCYTE PERCENT: 52.1 % (ref 37–80)
HEMATOCRIT: 41.9 % (ref 37.7–47.9)
Hemoglobin: 13.9 g/dL (ref 12.2–16.2)
LYMPH, POC: 2.1 (ref 0.6–3.4)
MCH: 27.1 pg (ref 27–31.2)
MCHC: 33.1 g/dL (ref 31.8–35.4)
MCV: 82 fL (ref 80–97)
MID (cbc): 0.6 (ref 0–0.9)
MPV: 8.2 fL (ref 0–99.8)
POC Granulocyte: 2.9 (ref 2–6.9)
POC LYMPH PERCENT: 37.8 %L (ref 10–50)
POC MID %: 10.1 % (ref 0–12)
Platelet Count, POC: 228 10*3/uL (ref 142–424)
RBC: 5.11 M/uL (ref 4.04–5.48)
RDW, POC: 13.1 %
WBC: 5.5 10*3/uL (ref 4.6–10.2)

## 2015-08-17 LAB — LIPID PANEL
Cholesterol: 234 mg/dL — ABNORMAL HIGH (ref 125–200)
HDL: 69 mg/dL (ref >=46)
LDL CALC: 141 mg/dL — AB (ref <130)
TRIGLYCERIDES: 122 mg/dL (ref <150)
Total CHOL/HDL Ratio: 3.4 Ratio (ref <=5.0)
VLDL: 24 mg/dL (ref <30)

## 2015-08-17 LAB — TSH: TSH: 1.459 u[IU]/mL (ref 0.350–4.500)

## 2015-08-17 LAB — HIV ANTIBODY (ROUTINE TESTING W REFLEX): HIV 1&2 Ab, 4th Generation: NONREACTIVE

## 2015-08-17 NOTE — Progress Notes (Signed)
08/17/2015 12:43 PM   DOB: 1981-03-15 / MRN: [id number]  SUBJECTIVE:  Alicia Werner is a 35 y.o. female presenting for an annual physical today. She has been in the [place] since 2005 and is from [place].  She is receiving pap smears and breast care from [facility] [address].  She has three children and are all doing very well.    Complains of a cough and sore throat that started roughly 3 days ago. Denies fever.  Says the whole family has been sick with similar symptoms.    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   She  has a past medical history of Allergy; Medical history non-contributory; and Vaginal delivery (2008, 2010).    She  reports that she has never smoked. She has never used smokeless tobacco. She reports that she does not drink alcohol or use illicit drugs. She  reports that she currently engages in sexual activity. The patient  has past surgical history that includes Facial cosmetic surgery (2003); No past surgeries; Cesarean section with bilateral tubal ligation (N/A, 03/28/2014); and Cesarean section.  Her family history includes Asthma in her son; Cancer in her maternal uncle.  Review of Systems  Constitutional: Positive for malaise/fatigue. Negative for fever, chills and diaphoresis.  HENT: Positive for congestion and sore throat.   Respiratory: Positive for cough. Negative for hemoptysis, shortness of breath and wheezing.   Cardiovascular: Negative for chest pain.  Gastrointestinal: Negative for nausea.  Skin: Negative for rash.  Neurological: Negative for dizziness and weakness.  Endo/Heme/Allergies: Negative for polydipsia.    Problem list and medications reviewed and updated by myself where necessary, and exist elsewhere in the encounter.   OBJECTIVE:  BP 102/64 mmHg  Pulse 92  Temp(Src) 98.7 F (37.1 C)  Resp 16  Ht  (1.626 m)  Wt 131 lb (59.421 kg)  BMI 22.47 kg/m2  SpO2 98%  LMP 08/17/2015  Physical Exam  Constitutional:  She is oriented to person, place, and time. She appears well-nourished. No distress.  Eyes: EOM are normal. Pupils are equal, round, and reactive to light.  Cardiovascular: Normal rate.   Pulmonary/Chest: Effort normal and breath sounds normal. No respiratory distress. She has no wheezes. She has no rales. She exhibits no tenderness.  Abdominal: Soft. Bowel sounds are normal. She exhibits no distension and no mass. There is no tenderness. There is no rebound and no guarding.  Musculoskeletal: Normal range of motion.  Neurological: She is alert and oriented to person, place, and time. No cranial nerve deficit. Gait normal.  Skin: Skin is warm and dry. She is not diaphoretic.  Psychiatric: She has a normal mood and affect.  Vitals reviewed.   Results for orders placed or performed in visit on 08/17/15 (from the past 72 hour(s))  POCT CBC     Status: None   Collection Time: 08/17/15 12:40 PM  Result Value Ref Range   WBC 5.5 4.6 - 10.2 K/uL   Lymph, poc 2.1 0.6 - 3.4   POC LYMPH PERCENT 37.8 10 - 50 %L   MID (cbc) 0.6 0 - 0.9   POC MID % 10.1 0 - 12 %M   POC Granulocyte 2.9 2 - 6.9   Granulocyte percent 52.1 37 - 80 %G   RBC 5.11 4.04 - 5.48 M/uL   Hemoglobin 13.9 12.2 - 16.2 g/dL   HCT, POC 40.9 81.1 - 47.9 %   MCV 82.0 80 - 97 fL   MCH, POC  27.1 27 - 31.2 pg   MCHC 33.1 31.8 - 35.4 g/dL   RDW, POC 16.1 %   Platelet Count, POC 228 142 - 424 K/uL   MPV 8.2 0 - 99.8 fL    ASSESSMENT AND PLAN  Marrisa was seen today for nasal congestion, cough, fatigue and generalized body aches.  Diagnoses and all orders for this visit:  Annual physical exam  Screening -     POCT CBC -     TSH -     HIV antibody -     Lipid panel -     Basic metabolic panel  Viral URI with cough: CBC and exam reassuring.  Will treat symptomatically.      The patient was advised to call or return to clinic if she does not see an improvement in symptoms or to seek the care of the closest emergency  department if she worsens with the above plan.   Deliah Boston, MHS, PA-C [facility] 08/17/2015 12:43 PM

## 2015-08-17 NOTE — Patient Instructions (Signed)
-   You have a viral upper respiratory infection, (the common cold) and it is not uncommon for symptoms to last 10 days to 2 weeks, regardless of which medications you take. Unfortunately antibiotics will not help your symptoms as they target bacteria, and you are likely suffering from a virus. Additionally, 1 in 8 people who take antibiotics suffer mild to severe side effects.    - You would benefit from high dose ibuprofen. TAKE 600-800 mg of ibuprofen every 8 hours as needed to control low grade fever, fatigue, and pain.    - I also recommend that you take Zyrtec-D 5-120 every morning or Claritin-D 10-240 for the next five to 10 days. This medication will help you with nasal congestion, post nasal drip and sneezing. You will have to purchase this directly from the pharmacist   Viral URI Medications: Please consult the pharmacist if you have questions. 600-800 mg ibuprofen every 8 hours as needed for the next five to ten days 5-120 Zyrtec-D every morning for five to 10 days OR Claritin D 10-240 every morning for five days.  Please visit the CDC's website https://www.cdc.gov/getsmart/ to learn about appropriate and inappropriate uses of antibiotics.    

## 2015-08-27 ENCOUNTER — Encounter: Payer: Self-pay | Admitting: Physician Assistant

## 2015-09-10 ENCOUNTER — Other Ambulatory Visit: Payer: Self-pay | Admitting: Obstetrics

## 2015-09-12 LAB — CYTOLOGY - PAP

## 2016-08-06 ENCOUNTER — Ambulatory Visit (INDEPENDENT_AMBULATORY_CARE_PROVIDER_SITE_OTHER): Payer: BLUE CROSS/BLUE SHIELD | Admitting: Family Medicine

## 2016-08-06 VITALS — BP 98/60 | HR 92 | Temp 98.3°F | Ht 64.0 in | Wt 132.6 lb

## 2016-08-06 DIAGNOSIS — J0100 Acute maxillary sinusitis, unspecified: Secondary | ICD-10-CM | POA: Diagnosis not present

## 2016-08-06 DIAGNOSIS — Z8619 Personal history of other infectious and parasitic diseases: Secondary | ICD-10-CM | POA: Diagnosis not present

## 2016-08-06 DIAGNOSIS — Z23 Encounter for immunization: Secondary | ICD-10-CM

## 2016-08-06 MED ORDER — AMOXICILLIN-POT CLAVULANATE 875-125 MG PO TABS: 1.0000 | ORAL_TABLET | Freq: Two times a day (BID) | ORAL | 0 refills | Status: DC

## 2016-08-06 MED ORDER — VALACYCLOVIR HCL 1 G PO TABS: 2000.0000 mg | ORAL_TABLET | Freq: Once | ORAL | 0 refills | Status: AC

## 2016-08-06 NOTE — Progress Notes (Signed)
Subjective:  By signing my name below, I, Alicia Werner, attest that this documentation has been prepared under the direction and in the presence of Alicia Staggers, MD.  Electronically Signed: Andrew Au, ED Scribe. 08/06/2016. 9:07 AM.  Patient ID: Alicia Werner, female    DOB: 09/08/1980, 36 y.o.   MRN: [id number]  HPI   Chief Complaint  Patient presents with  . Sore Throat    X 2 week with runny nose  . Cough    X 2 weeks with bodyaches and chills    HPI Comments: [name] is a 36 y.o. female who presents to the Primary Care at [facility] complaining of nasal congestion. Pt state symptoms started 2 weeks ago with chills, body ache, nasal congestion with green discharge and cough. Pt also notes having a blister at corner of mouth; has had blisters in the past, about 3 years ago.  Symptom started to improved, but developed worsening congestion with yellow/green discharge along with sore throat and sinus pressure and pain. She has has tried Careers adviser, Careers adviser D, tylenol. Pt reports hx of sinus infection. Pt has not had flu shot this season.   Patient Active Problem List   Diagnosis Date Noted  . Cesarean delivery delivered 03/28/2014  . Allergic rhinitis due to other allergen 09/09/2012  . Other chronic allergic conjunctivitis 09/09/2012  . 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 Other atopic dermatitis and related conditions 09/09/2012   Past Medical History:  Diagnosis Date  . Allergy   . Medical history non-contributory   . Vaginal delivery 2008, 2010   Past Surgical History:  Procedure Laterality Date  . CESAREAN SECTION    . CESAREAN SECTION WITH BILATERAL TUBAL LIGATION N/A 03/28/2014   Procedure: CESAREAN SECTION WITH BILATERAL TUBAL LIGATION;  Surgeon: Marlow Baars, MD;  Location: [facility];  Service: Obstetrics;  Laterality: N/A;  . FACIAL COSMETIC SURGERY  2003  . NO PAST SURGERIES     Allergies  Allergen Reactions  . Shellfish Allergy Hives and Swelling     Patient reports facial swelling, redness, and hives after eating shellfish. Allergy testing done.   . Peanuts [Peanut Oil]   . Pineapple     Patient reports scratchy, sore throat after eating.    Prior to Admission medications   Medication Sig Start Date End Date Taking? Authorizing Provider  guaiFENesin (MUCINEX) 600 MG 12 hr tablet Take by mouth 2 (two) times daily.    Historical Provider, MD  montelukast (SINGULAIR) 10 MG tablet Take 10 mg by mouth at bedtime. Reported on 08/17/2015    Historical Provider, MD  Prenatal Vit-Fe Fumarate-FA (PREPLUS) 27-1 MG TABS TAKE ONE TABLET BY MOUTH ONCE DAILY Patient not taking: Reported on 08/17/2015 09/16/14   Porfirio Oar, PA-C   Social History   Social History  . Marital status: Married    Spouse name: N/A  . Number of children: N/A  . Years of education: N/A   Occupational History  . Not on file.   Social History Main Topics  . Smoking status: Never Smoker  . Smokeless tobacco: Never Used  . Alcohol use No  . Drug use: No  . Sexual activity: Yes   Other Topics Concern  . Not on file   Social History Narrative  . No narrative on file   Review of Systems  Constitutional: Positive for chills. Negative for fever.  HENT: Positive for congestion, sinus pain, sinus pressure and sore throat.   Respiratory: Positive for cough.  Objective:  Physical Exam  Constitutional: She is oriented to person, place, and time. She appears well-developed and well-nourished. No distress.  HENT:  Head: Normocephalic and atraumatic.  Right Ear: Hearing, tympanic membrane, external ear and ear canal normal.  Left Ear: Hearing, tympanic membrane, external ear and ear canal normal.  Nose: Right sinus exhibits maxillary sinus tenderness. Left sinus exhibits maxillary sinus tenderness.  Mouth/Throat: Oropharynx is clear and moist. No oropharyngeal exudate or posterior oropharyngeal erythema.  Eyes: Conjunctivae and EOM are normal. Pupils are equal,  round, and reactive to light.  Cardiovascular: Normal rate, regular rhythm, normal heart sounds and intact distal pulses.   No murmur heard. Pulmonary/Chest: Effort normal and breath sounds normal. No respiratory distress. She has no wheezes. She has no rhonchi.  Neurological: She is alert and oriented to person, place, and time.  Skin: Skin is warm and dry. No rash noted.  Psychiatric: She has a normal mood and affect. Her behavior is normal.  Vitals reviewed.   Vitals:   08/06/16 0839  BP: 98/60  Pulse: 92  Temp: 98.3 F (36.8 C)  TempSrc: Oral  SpO2: 98%  Weight: 132 lb 9.6 oz (60.1 kg)  Height: 5\' 4"  (1.626 m)    Assessment & Plan:    [name] is a 36 y.o. female Acute maxillary sinusitis, recurrence not specified - Plan: amoxicillin-clavulanate (AUGMENTIN) 875-125 MG tablet  - start Augmentin, symptomatic care discussed with tylenol, saline NS, decongestant with Allegra-D or Sudafed if needed. RTC precautions.  Need for prophylactic vaccination and inoculation against influenza  -Due to worsening symptoms with current illness, decided to defer for at least a few days -  to return next week for flu vaccine here or other provider if needed as long as she is afebrile.   H/O cold sores - Plan: valACYclovir (VALTREX) 1000 MG tablet  - Improved currently, Valtrex prescription given for next flair.   Meds ordered this encounter  Medications  . valACYclovir (VALTREX) 1000 MG tablet    Sig: Take 2 tablets (2,000 mg total) by mouth once. Then repeat dose once in 12 hours.    Dispense:  12 tablet    Refill:  0  . amoxicillin-clavulanate (AUGMENTIN) 875-125 MG tablet    Sig: Take 1 tablet by mouth 2 (two) times daily.    Dispense:  20 tablet    Refill:  0   Patient Instructions    Start Augmentin twice per day for sinus infection, saline nasal spray 4-5 times per day, see other information below. I prescribed Valtrex if needed for the next time you have symptoms of cold  sores. See information on that below. As your sinus symptoms improve within the next week, return for flu vaccine. You can also have that done anywhere in the community that is giving flu vaccines if needed.  Return to the clinic or go to the nearest emergency room if any of your symptoms worsen or new symptoms occur.   Sinusitis, Adult Sinusitis is soreness and inflammation of your sinuses. Sinuses are hollow spaces in the bones around your face. Your sinuses are located:  Around your eyes.  In the middle of your forehead.  Behind your nose.  In your cheekbones. Your sinuses and nasal passages are lined with a stringy fluid (mucus). Mucus normally drains out of your sinuses. When your nasal tissues become inflamed or swollen, the mucus can become trapped or blocked so air cannot flow through your sinuses. This allows bacteria, viruses, and funguses  to grow, which leads to infection. Sinusitis can develop quickly and last for 7?10 days (acute) or for more than 12 weeks (chronic). Sinusitis often develops after a cold. What are the causes? This condition is caused by anything that creates swelling in the sinuses or stops mucus from draining, including:  Allergies.  Asthma.  Bacterial or viral infection.  Abnormally shaped bones between the nasal passages.  Nasal growths that contain mucus (nasal polyps).  Narrow sinus openings.  Pollutants, such as chemicals or irritants in the air.  A foreign object stuck in the nose.  A fungal infection. This is rare. What increases the risk? The following factors may make you more likely to develop this condition:  Having allergies or asthma.  Having had a recent cold or respiratory tract infection.  Having structural deformities or blockages in your nose or sinuses.  Having a weak immune system.  Doing a lot of swimming or diving.  Overusing nasal sprays.  Smoking. What are the signs or symptoms? The main symptoms of this  condition are pain and a feeling of pressure around the affected sinuses. Other symptoms include:  Upper toothache.  Earache.  Headache.  Bad breath.  Decreased sense of smell and taste.  A cough that may get worse at night.  Fatigue.  Fever.  Thick drainage from your nose. The drainage is often green and it may contain pus (purulent).  Stuffy nose or congestion.  Postnasal drip. This is when extra mucus collects in the throat or back of the nose.  Swelling and warmth over the affected sinuses.  Sore throat.  Sensitivity to light. How is this diagnosed? This condition is diagnosed based on symptoms, a medical history, and a physical exam. To find out if your condition is acute or chronic, your health care provider may:  Look in your nose for signs of nasal polyps.  Tap over the affected sinus to check for signs of infection.  View the inside of your sinuses using an imaging device that has a light attached (endoscope). If your health care provider suspects that you have chronic sinusitis, you may also:  Be tested for allergies.  Have a sample of mucus taken from your nose (nasal culture) and checked for bacteria.  Have a mucus sample examined to see if your sinusitis is related to an allergy. If your sinusitis does not respond to treatment and it lasts longer than 8 weeks, you may have an MRI or CT scan to check your sinuses. These scans also help to determine how severe your infection is. In rare cases, a bone biopsy may be done to rule out more serious types of fungal sinus disease. How is this treated? Treatment for sinusitis depends on the cause and whether your condition is chronic or acute. If a virus is causing your sinusitis, your symptoms will go away on their own within 10 days. You may be given medicines to relieve your symptoms, including:  Topical nasal decongestants. They shrink swollen nasal passages and let mucus drain from your  sinuses.  Antihistamines. These drugs block inflammation that is triggered by allergies. This can help to ease swelling in your nose and sinuses.  Topical nasal corticosteroids. These are nasal sprays that ease inflammation and swelling in your nose and sinuses.  Nasal saline washes. These rinses can help to get rid of thick mucus in your nose. If your condition is caused by bacteria, you will be given an antibiotic medicine. If your condition is caused by a  fungus, you will be given an antifungal medicine. Surgery may be needed to correct underlying conditions, such as narrow nasal passages. Surgery may also be needed to remove polyps. Follow these instructions at home: Medicines  Take, use, or apply over-the-counter and prescription medicines only as told by your health care provider. These may include nasal sprays.  If you were prescribed an antibiotic medicine, take it as told by your health care provider. Do not stop taking the antibiotic even if you start to feel better. Hydrate and Humidify  Drink enough water to keep your urine clear or pale yellow. Staying hydrated will help to thin your mucus.  Use a cool mist humidifier to keep the humidity level in your home above 50%.  Inhale steam for 10-15 minutes, 3-4 times a day or as told by your health care provider. You can do this in the bathroom while a hot shower is running.  Limit your exposure to cool or dry air. Rest  Rest as much as possible.  Sleep with your head raised (elevated).  Make sure to get enough sleep each night. General instructions  Apply a warm, moist washcloth to your face 3-4 times a day or as told by your health care provider. This will help with discomfort.  Wash your hands often with soap and water to reduce your exposure to viruses and other germs. If soap and water are not available, use hand sanitizer.  Do not smoke. Avoid being around people who are smoking (secondhand smoke).  Keep all  follow-up visits as told by your health care provider. This is important. Contact a health care provider if:  You have a fever.  Your symptoms get worse.  Your symptoms do not improve within 10 days. Get help right away if:  You have a severe headache.  You have persistent vomiting.  You have pain or swelling around your face or eyes.  You have vision problems.  You develop confusion.  Your neck is stiff.  You have trouble breathing. This information is not intended to replace advice given to you by your health care provider. Make sure you discuss any questions you have with your health care provider. Document Released: 06/28/2005 Document Revised: 02/22/2016 Document Reviewed: 04/23/2015 Elsevier Interactive Patient Education  2017 [employer]   Cold Sore A cold sore (fever blister) is a skin infection caused by the herpes simplex virus (HSV-1). HSV-1 is closely related to the virus that causes genital herpes (HSV-2), but they are not the same even though both viruses can cause oral and genital infections. Cold sores are Werner, fluid-filled sores inside of the mouth or on the lips, gums, nose, chin, cheeks, or fingers.  The herpes simplex virus can be easily passed (contagious) to other people through close personal contact, such as kissing or sharing personal items. The virus can also spread to other parts of the body, such as the eyes or genitals. Cold sores are contagious until the sores crust over completely. They often heal within 2 weeks.  Once a person is infected, the herpes simplex virus remains permanently in the body. Therefore, there is no cure for cold sores, and they often recur when a person is tired, stressed, sick, or gets too much sun. Additional factors that can cause a recurrence include hormone changes in menstruation or pregnancy, certain drugs, and cold weather.  CAUSES  Cold sores are caused by the herpes simplex virus. The virus is spread from person to  person through close contact, such as  through kissing, touching the affected area, or sharing personal items such as lip balm, razors, or eating utensils.  SYMPTOMS  The first infection may not cause symptoms. If symptoms develop, the symptoms often go through different stages. Here is how a cold sore develops:   Tingling, itching, or burning is felt 1-2 days before the outbreak.   Fluid-filled blisters appear on the lips, inside the mouth, nose, or on the cheeks.   The blisters start to ooze clear fluid.   The blisters dry up and a yellow crust appears in its place.   The crust falls off.  Symptoms depend on whether it is the initial outbreak or a recurrence. Some other symptoms with the first outbreak may include:   Fever.   Sore throat.   Headache.   Muscle aches.   Swollen neck glands.  DIAGNOSIS  A diagnosis is often made based on your symptoms and looking at the sores. Sometimes, a sore may be swabbed and then examined in the lab to make a final diagnosis. If the sores are not present, blood tests can find the herpes simplex virus.  TREATMENT  There is no cure for cold sores and no vaccine for the herpes simplex virus. Within 2 weeks, most cold sores go away on their own without treatment. Medicines cannot make the infection go away, but medicine can help relieve some of the pain associated with the sores, can work to stop the virus from multiplying, and can also shorten healing time. Medicine may be in the form of creams, gels, pills, or a shot.  HOME CARE INSTRUCTIONS   Only take over-the-counter or prescription medicines for pain, discomfort, or fever as directed by your caregiver. Do not use aspirin.   Use a cotton-tip swab to apply creams or gels to your sores.   Do not touch the sores or pick the scabs. Wash your hands often. Do not touch your eyes without washing your hands first.   Avoid kissing, oral sex, and sharing personal items until sores heal.    Apply an ice pack on your sores for 10-15 minutes to ease any discomfort.   Avoid hot, cold, or salty foods because they may hurt your mouth. Eat a soft, bland diet to avoid irritating the sores. Use a straw to drink if you have pain when drinking out of a glass.   Keep sores clean and dry to prevent an infection of other tissues.   Avoid the sun and limit stress if these things trigger outbreaks. If sun causes cold sores, apply sunscreen on the lips before being out in the sun.  SEEK MEDICAL CARE IF:   You have a fever or persistent symptoms for more than 2-3 days.   You have a fever and your symptoms suddenly get worse.   You have pus, not clear fluid, coming from the sores.   You have redness that is spreading.   You have pain or irritation in your eye.   You get sores on your genitals.   Your sores do not heal within 2 weeks.   You have a weakened immune system.   You have frequent recurrences of cold sores.  MAKE SURE YOU:   Understand these instructions.  Will watch your condition.  Will get help right away if you are not doing well or get worse. This information is not intended to replace advice given to you by your health care provider. Make sure you discuss any questions you have with your health care  provider. Document Released: 06/25/2000 Document Revised: 07/19/2014 Document Reviewed: 04/18/2015 Elsevier Interactive Patient Education  2017 [employer].   IF you received an x-ray today, you will receive an invoice from [facility]. Please contact [facility] at [phone] with questions or concerns regarding your invoice.   IF you received labwork today, you will receive an invoice from [place]. Please contact [facility] at [phone] with questions or concerns regarding your invoice.   Our billing staff will not be able to assist you with questions regarding bills from these companies.  You will be contacted with the lab  results as soon as they are available. The fastest way to get your results is to activate your My Chart account. Instructions are located on the last page of this paperwork. If you have not heard from [place] regarding the results in 2 weeks, please contact this office.       I personally performed the services described in this documentation, which was scribed in my presence. The recorded information has been reviewed and considered, and addended by me as needed.   Signed,   Alicia Staggers, MD Primary Care at [facility].  08/06/16 9:24 AM

## 2016-08-06 NOTE — Patient Instructions (Addendum)
Start Augmentin twice per day for sinus infection, saline nasal spray 4-5 times per day, see other information below. I prescribed Valtrex if needed for the next time you have symptoms of cold sores. See information on that below. As your sinus symptoms improve within the next week, return for flu vaccine. You can also have that done anywhere in the community that is giving flu vaccines if needed.  Return to the clinic or go to the nearest emergency room if any of your symptoms worsen or new symptoms occur.   Sinusitis, Adult Sinusitis is soreness and inflammation of your sinuses. Sinuses are hollow spaces in the bones around your face. Your sinuses are located:  Around your eyes.  In the middle of your forehead.  Behind your nose.  In your cheekbones. Your sinuses and nasal passages are lined with a stringy fluid (mucus). Mucus normally drains out of your sinuses. When your nasal tissues become inflamed or swollen, the mucus can become trapped or blocked so air cannot flow through your sinuses. This allows bacteria, viruses, and funguses to grow, which leads to infection. Sinusitis can develop quickly and last for 7?10 days (acute) or for more than 12 weeks (chronic). Sinusitis often develops after a cold. What are the causes? This condition is caused by anything that creates swelling in the sinuses or stops mucus from draining, including:  Allergies.  Asthma.  Bacterial or viral infection.  Abnormally shaped bones between the nasal passages.  Nasal growths that contain mucus (nasal polyps).  Narrow sinus openings.  Pollutants, such as chemicals or irritants in the air.  A foreign object stuck in the nose.  A fungal infection. This is rare. What increases the risk? The following factors may make you more likely to develop this condition:  Having allergies or asthma.  Having had a recent cold or respiratory tract infection.  Having structural deformities or blockages in  your nose or sinuses.  Having a weak immune system.  Doing a lot of swimming or diving.  Overusing nasal sprays.  Smoking. What are the signs or symptoms? The main symptoms of this condition are pain and a feeling of pressure around the affected sinuses. Other symptoms include:  Upper toothache.  Earache.  Headache.  Bad breath.  Decreased sense of smell and taste.  A cough that may get worse at night.  Fatigue.  Fever.  Thick drainage from your nose. The drainage is often green and it may contain pus (purulent).  Stuffy nose or congestion.  Postnasal drip. This is when extra mucus collects in the throat or back of the nose.  Swelling and warmth over the affected sinuses.  Sore throat.  Sensitivity to light. How is this diagnosed? This condition is diagnosed based on symptoms, a medical history, and a physical exam. To find out if your condition is acute or chronic, your health care provider may:  Look in your nose for signs of nasal polyps.  Tap over the affected sinus to check for signs of infection.  View the inside of your sinuses using an imaging device that has a light attached (endoscope). If your health care provider suspects that you have chronic sinusitis, you may also:  Be tested for allergies.  Have a sample of mucus taken from your nose (nasal culture) and checked for bacteria.  Have a mucus sample examined to see if your sinusitis is related to an allergy. If your sinusitis does not respond to treatment and it lasts longer than 8 weeks, you  may have an MRI or CT scan to check your sinuses. These scans also help to determine how severe your infection is. In rare cases, a bone biopsy may be done to rule out more serious types of fungal sinus disease. How is this treated? Treatment for sinusitis depends on the cause and whether your condition is chronic or acute. If a virus is causing your sinusitis, your symptoms will go away on their own within 10  days. You may be given medicines to relieve your symptoms, including:  Topical nasal decongestants. They shrink swollen nasal passages and let mucus drain from your sinuses.  Antihistamines. These drugs block inflammation that is triggered by allergies. This can help to ease swelling in your nose and sinuses.  Topical nasal corticosteroids. These are nasal sprays that ease inflammation and swelling in your nose and sinuses.  Nasal saline washes. These rinses can help to get rid of thick mucus in your nose. If your condition is caused by bacteria, you will be given an antibiotic medicine. If your condition is caused by a fungus, you will be given an antifungal medicine. Surgery may be needed to correct underlying conditions, such as narrow nasal passages. Surgery may also be needed to remove polyps. Follow these instructions at home: Medicines  Take, use, or apply over-the-counter and prescription medicines only as told by your health care provider. These may include nasal sprays.  If you were prescribed an antibiotic medicine, take it as told by your health care provider. Do not stop taking the antibiotic even if you start to feel better. Hydrate and Humidify  Drink enough water to keep your urine clear or pale yellow. Staying hydrated will help to thin your mucus.  Use a cool mist humidifier to keep the humidity level in your home above 50%.  Inhale steam for 10-15 minutes, 3-4 times a day or as told by your health care provider. You can do this in the bathroom while a hot shower is running.  Limit your exposure to cool or dry air. Rest  Rest as much as possible.  Sleep with your head raised (elevated).  Make sure to get enough sleep each night. General instructions  Apply a warm, moist washcloth to your face 3-4 times a day or as told by your health care provider. This will help with discomfort.  Wash your hands often with soap and water to reduce your exposure to viruses and  other germs. If soap and water are not available, use hand sanitizer.  Do not smoke. Avoid being around people who are smoking (secondhand smoke).  Keep all follow-up visits as told by your health care provider. This is important. Contact a health care provider if:  You have a fever.  Your symptoms get worse.  Your symptoms do not improve within 10 days. Get help right away if:  You have a severe headache.  You have persistent vomiting.  You have pain or swelling around your face or eyes.  You have vision problems.  You develop confusion.  Your neck is stiff.  You have trouble breathing. This information is not intended to replace advice given to you by your health care provider. Make sure you discuss any questions you have with your health care provider. Document Released: 06/28/2005 Document Revised: 02/22/2016 Document Reviewed: 04/23/2015 Elsevier Interactive Patient Education  2017 [employer]   Cold Sore A cold sore (fever blister) is a skin infection caused by the herpes simplex virus (HSV-1). HSV-1 is closely related to the  virus that causes genital herpes (HSV-2), but they are not the same even though both viruses can cause oral and genital infections. Cold sores are small, fluid-filled sores inside of the mouth or on the lips, gums, nose, chin, cheeks, or fingers.  The herpes simplex virus can be easily passed (contagious) to other people through close personal contact, such as kissing or sharing personal items. The virus can also spread to other parts of the body, such as the eyes or genitals. Cold sores are contagious until the sores crust over completely. They often heal within 2 weeks.  Once a person is infected, the herpes simplex virus remains permanently in the body. Therefore, there is no cure for cold sores, and they often recur when a person is tired, stressed, sick, or gets too much sun. Additional factors that can cause a recurrence include hormone changes  in menstruation or pregnancy, certain drugs, and cold weather.  CAUSES  Cold sores are caused by the herpes simplex virus. The virus is spread from person to person through close contact, such as through kissing, touching the affected area, or sharing personal items such as lip balm, razors, or eating utensils.  SYMPTOMS  The first infection may not cause symptoms. If symptoms develop, the symptoms often go through different stages. Here is how a cold sore develops:   Tingling, itching, or burning is felt 1-2 days before the outbreak.   Fluid-filled blisters appear on the lips, inside the mouth, nose, or on the cheeks.   The blisters start to ooze clear fluid.   The blisters dry up and a yellow crust appears in its place.   The crust falls off.  Symptoms depend on whether it is the initial outbreak or a recurrence. Some other symptoms with the first outbreak may include:   Fever.   Sore throat.   Headache.   Muscle aches.   Swollen neck glands.  DIAGNOSIS  A diagnosis is often made based on your symptoms and looking at the sores. Sometimes, a sore may be swabbed and then examined in the lab to make a final diagnosis. If the sores are not present, blood tests can find the herpes simplex virus.  TREATMENT  There is no cure for cold sores and no vaccine for the herpes simplex virus. Within 2 weeks, most cold sores go away on their own without treatment. Medicines cannot make the infection go away, but medicine can help relieve some of the pain associated with the sores, can work to stop the virus from multiplying, and can also shorten healing time. Medicine may be in the form of creams, gels, pills, or a shot.  HOME CARE INSTRUCTIONS   Only take over-the-counter or prescription medicines for pain, discomfort, or fever as directed by your caregiver. Do not use aspirin.   Use a cotton-tip swab to apply creams or gels to your sores.   Do not touch the sores or pick the  scabs. Wash your hands often. Do not touch your eyes without washing your hands first.   Avoid kissing, oral sex, and sharing personal items until sores heal.   Apply an ice pack on your sores for 10-15 minutes to ease any discomfort.   Avoid hot, cold, or salty foods because they may hurt your mouth. Eat a soft, bland diet to avoid irritating the sores. Use a straw to drink if you have pain when drinking out of a glass.   Keep sores clean and dry to prevent an infection of other  tissues.   Avoid the sun and limit stress if these things trigger outbreaks. If sun causes cold sores, apply sunscreen on the lips before being out in the sun.  SEEK MEDICAL CARE IF:   You have a fever or persistent symptoms for more than 2-3 days.   You have a fever and your symptoms suddenly get worse.   You have pus, not clear fluid, coming from the sores.   You have redness that is spreading.   You have pain or irritation in your eye.   You get sores on your genitals.   Your sores do not heal within 2 weeks.   You have a weakened immune system.   You have frequent recurrences of cold sores.  MAKE SURE YOU:   Understand these instructions.  Will watch your condition.  Will get help right away if you are not doing well or get worse. This information is not intended to replace advice given to you by your health care provider. Make sure you discuss any questions you have with your health care provider. Document Released: 06/25/2000 Document Revised: 07/19/2014 Document Reviewed: 04/18/2015 Elsevier Interactive Patient Education  2017 [employer].   IF you received an x-ray today, you will receive an invoice from [facility]. Please contact [facility] at [phone] with questions or concerns regarding your invoice.   IF you received labwork today, you will receive an invoice from [place]. Please contact [facility] at [phone] with questions or concerns  regarding your invoice.   Our billing staff will not be able to assist you with questions regarding bills from these companies.  You will be contacted with the lab results as soon as they are available. The fastest way to get your results is to activate your My Chart account. Instructions are located on the last page of this paperwork. If you have not heard from [place] regarding the results in 2 weeks, please contact this office.

## 2016-11-22 ENCOUNTER — Encounter: Payer: Self-pay | Admitting: Family Medicine

## 2016-11-22 ENCOUNTER — Ambulatory Visit (INDEPENDENT_AMBULATORY_CARE_PROVIDER_SITE_OTHER): Payer: BLUE CROSS/BLUE SHIELD | Admitting: Family Medicine

## 2016-11-22 VITALS — BP 118/74 | HR 99 | Temp 97.9°F | Resp 18 | Ht 64.96 in | Wt 130.8 lb

## 2016-11-22 DIAGNOSIS — J329 Chronic sinusitis, unspecified: Secondary | ICD-10-CM

## 2016-11-22 MED ORDER — AMOXICILLIN-POT CLAVULANATE 875-125 MG PO TABS: 1.0000 | ORAL_TABLET | Freq: Two times a day (BID) | ORAL | 0 refills | Status: DC

## 2016-11-22 NOTE — Progress Notes (Signed)
  Alicia SorensonHuong T Werner - 36 y.o. female MRN [id number]  Date of birth: 27-Apr-1981  SUBJECTIVE:  Including CC & ROS.  Chief Complaint  Patient presents with  . Facial Pain    X 2 weeks  . Cough    X 2 weeks -with SOB  . Fatigue    X 2 days   [name] is a 36 yo F that is presenting fascial pain and runny nose for two weeks. She got worse about three days ago. Having some chills. Having a cough with pain in her chest. Some pain in the maxillary sinus pain. She feels like her face has swollen. She feels tired today. Has tried tylenol and allegra-D. No travel or pets or animals or new medications. Sick contacts from her children. She has a sore throat as well.   ROS: No unexpected weight loss, fever, chills, swelling, instability, muscle pain, numbness/tingling, redness, otherwise see HPI   HISTORY: Past Medical, Surgical, Social, and Family History Reviewed & Updated per EMR.   Pertinent Historical Findings include: PMHx: shellfish allergies  Surgical:   c-section  Social:  No tobacco or alcohol use  FHx: none  DATA REVIEWED: none  PHYSICAL EXAM:  VS: BP 118/74 (BP Location: Right Arm, Patient Position: Sitting, Cuff Size: Small)   Pulse 99   Temp 97.9 F (36.6 C) (Oral)   Resp 18   Ht 5' 4.96" (1.65 m)   Wt 130 lb 12.8 oz (59.3 kg)   LMP 10/29/2016 (Approximate)   SpO2 98%   BMI 21.79 kg/m  PHYSICAL EXAM: Gen: NAD, alert, cooperative with exam,  HEENT: NCAT, EOMI, clear conjunctiva, oropharynx clear, supple neck, Tympanic membranes clear and intact bilaterally, no cervical lymphadenopathy, some frontal and maxillary sinus tenderness to palpation, no tonsillar exudates  CV: RRR, good S1/S2, no murmur, no edema, capillary refill brisk  Resp: CTABL, no wheezes, non-labored Skin: no rashes, normal turgor  Neuro: no gross deficits.  Psych: alert and oriented   ASSESSMENT & PLAN:   Recurrent sinusitis Her symptoms be seen be consistent with sinusitis. She has had an episode in January  reports for 5 episodes over the course of the year. - Augmentin for 10 days. - Referral was placed to ENT. - Given indications to return.

## 2016-11-22 NOTE — Patient Instructions (Addendum)
Thank you for coming in,   You can try things such as zyrtec-D or allegra-D which is an antihistamine and decongestant.   You can try afrin which will help with nasal congestion but use for only three days.   You can also try using a netti pot on a regular occasion.  Please try to take a probiotic when you are taking an antibiotic.   Please follow up if your symptoms fail to improve.     Please feel free to call with any questions or concerns at any time, at [phone]. --Dr. Jordan LikesSchmitz     IF you received an x-ray today, you will receive an invoice from [facility]. Please contact [facility] at [phone] with questions or concerns regarding your invoice.   IF you received labwork today, you will receive an invoice from [facility]. Please contact [facility] at [phone] with questions or concerns regarding your invoice.   Our billing staff will not be able to assist you with questions regarding bills from these companies.  You will be contacted with the lab results as soon as they are available. The fastest way to get your results is to activate your My Chart account. Instructions are located on the last page of this paperwork. If you have not heard from us regarding the results in 2 weeks, please contact this office.

## 2016-11-23 ENCOUNTER — Ambulatory Visit: Payer: BLUE CROSS/BLUE SHIELD | Admitting: Family Medicine

## 2016-11-23 DIAGNOSIS — J329 Chronic sinusitis, unspecified: Secondary | ICD-10-CM | POA: Insufficient documentation

## 2016-11-23 NOTE — Assessment & Plan Note (Signed)
Her symptoms be seen be consistent with sinusitis. She has had an episode in January reports for 5 episodes over the course of the year. - Augmentin for 10 days. - Referral was placed to ENT. - Given indications to return.

## 2016-12-22 ENCOUNTER — Ambulatory Visit
Admission: RE | Admit: 2016-12-22 | Discharge: 2016-12-22 | Disposition: A | Discharge status: Home / Self Care | Payer: BLUE CROSS/BLUE SHIELD | Source: Ambulatory Visit | Attending: Otolaryngology | Admitting: Otolaryngology

## 2016-12-22 ENCOUNTER — Other Ambulatory Visit: Payer: Self-pay | Admitting: Otolaryngology

## 2016-12-22 DIAGNOSIS — J322 Chronic ethmoidal sinusitis: Secondary | ICD-10-CM

## 2016-12-22 DIAGNOSIS — J320 Chronic maxillary sinusitis: Secondary | ICD-10-CM

## 2017-06-06 ENCOUNTER — Ambulatory Visit: Payer: BLUE CROSS/BLUE SHIELD | Admitting: Family Medicine

## 2017-06-06 ENCOUNTER — Other Ambulatory Visit: Payer: Self-pay

## 2017-06-06 ENCOUNTER — Encounter: Payer: Self-pay | Admitting: Family Medicine

## 2017-06-06 VITALS — BP 100/78 | HR 88 | Temp 98.0°F | Resp 16 | Ht 64.96 in | Wt 131.8 lb

## 2017-06-06 DIAGNOSIS — J069 Acute upper respiratory infection, unspecified: Secondary | ICD-10-CM

## 2017-06-06 DIAGNOSIS — L308 Other specified dermatitis: Secondary | ICD-10-CM | POA: Diagnosis not present

## 2017-06-06 DIAGNOSIS — J320 Chronic maxillary sinusitis: Secondary | ICD-10-CM

## 2017-06-06 MED ORDER — AMOXICILLIN-POT CLAVULANATE 875-125 MG PO TABS: 1.0000 | ORAL_TABLET | Freq: Two times a day (BID) | ORAL | 0 refills | Status: DC

## 2017-06-06 MED ORDER — TRIAMCINOLONE ACETONIDE 0.1 % EX CREA: 1.0000 "application " | TOPICAL_CREAM | Freq: Two times a day (BID) | CUTANEOUS | 1 refills | Status: DC

## 2017-06-06 MED ORDER — FLUTICASONE PROPIONATE 50 MCG/ACT NA SUSP: 2.0000 | Freq: Every day | NASAL | 1 refills | Status: DC

## 2017-06-06 NOTE — Addendum Note (Signed)
Addended by: HOPPER, DAVID H on: 06/06/2017 10:47 AM   Modules accepted: Orders, Level of Service

## 2017-06-06 NOTE — Patient Instructions (Addendum)
Drink plenty of fluids and get enough rest  Stay off work through Tuesday.  Take the Augmentin (amoxicillin/clavulanate) one pill twice daily  Take the fluticasone nose spray 2 sprays each nostril twice daily for 3 days, then decrease to once daily  Return if worse or not improving  Use cream on elbows twice daily.    IF you received an x-ray today, you will receive an invoice from [facility]. Please contact [facility] at [phone] with questions or concerns regarding your invoice.   IF you received labwork today, you will receive an invoice from [facility]. Please contact [facility] at [phone] with questions or concerns regarding your invoice.   Our billing staff will not be able to assist you with questions regarding bills from these companies.  You will be contacted with the lab results as soon as they are available. The fastest way to get your results is to activate your My Chart account. Instructions are located on the last page of this paperwork. If you have not heard from us regarding the results in 2 weeks, please contact this office.

## 2017-06-06 NOTE — Progress Notes (Addendum)
Patient ID: Alicia Werner, female    DOB: 12-Jun-1981  Age: 36 y.o. MRN: [id number]  Chief Complaint  Patient presents with  . URI    nasal congestion, sore throat, x 2-3 weeks, clear to yellow, before Thanksgiving had muscle aches, and cold chills     Subjective:   36 year old lady who has had a respiratory tract infection for 2-3 weeks. She has had a lot of nasal congestion and now her sore throat. She's had some clear to yellow mucus. Before Thanksgiving she had body aches. She had had some cold chills, but no documented fevers. She works with people, doing nails. She has 3 children who have also had some illnesses. One of the children is being taken to the doctor today. She does not smoke.  Current allergies, medications, problem list, past/family and social histories reviewed.  Objective:  BP 100/78   Pulse 88   Temp 98 F (36.7 C)   Resp 16   Ht 5' 4.96" (1.65 m)   Wt 131 lb 12.8 oz (59.8 kg)   SpO2 98%   BMI 21.96 kg/m   No major acute distress. Her TMs are normal. Nose is stuffy. Her maxillary sinus is a little bit tender especially on the right. Throat is erythematous more on the right with evidence of postnasal drainage. Neck supple without significant nodes. Chest is clear to auscultation. Heart regular without murmurs.  Assessment & Plan:   Assessment: 1. Right maxillary sinusitis   2. Acute upper respiratory infection   3. Other eczema       Plan: Consistent with a upper respiratory virus that has caused a secondary right maxillary sinusitis. Will get out of a round of anabolic therapy and try to get her nose and sinuses opened up.  No orders of the defined types were placed in this encounter.   Meds ordered this encounter  Medications  . amoxicillin-clavulanate (AUGMENTIN) 875-125 MG tablet    Sig: Take 1 tablet by mouth 2 (two) times daily.    Dispense:  20 tablet    Refill:  0  . fluticasone (FLONASE) 50 MCG/ACT nasal spray    Sig: Place 2 sprays into  both nostrils daily.    Dispense:  16 g    Refill:  1  . triamcinolone cream (KENALOG) 0.1 %    Sig: Apply 1 application topically 2 (two) times daily.    Dispense:  30 g    Refill:  1         Patient Instructions   Drink plenty of fluids and get enough rest  Stay off work through Tuesday.  Take the Augmentin (amoxicillin/clavulanate) one pill twice daily  Take the fluticasone nose spray 2 sprays each nostril twice daily for 3 days, then decrease to once daily  Return if worse or not improving  Use cream on elbows twice daily.    IF you received an x-ray today, you will receive an invoice from [facility]. Please contact [facility] at [phone] with questions or concerns regarding your invoice.   IF you received labwork today, you will receive an invoice from [facility]. Please contact [facility] at [phone] with questions or concerns regarding your invoice.   Our billing staff will not be able to assist you with questions regarding bills from these companies.  You will be contacted with the lab results as soon as they are available. The fastest way to get your results is to activate your My Chart account. Instructions are located on the  last page of this paperwork. If you have not heard from us regarding the results in 2 weeks, please contact this office.         Return if symptoms worsen or fail to improve.   HOPPER,DAVID, MD 06/06/2017

## 2017-09-13 ENCOUNTER — Ambulatory Visit: Payer: BLUE CROSS/BLUE SHIELD | Admitting: Family Medicine

## 2017-09-13 ENCOUNTER — Encounter: Payer: Self-pay | Admitting: Family Medicine

## 2017-09-13 ENCOUNTER — Ambulatory Visit (INDEPENDENT_AMBULATORY_CARE_PROVIDER_SITE_OTHER): Payer: BLUE CROSS/BLUE SHIELD

## 2017-09-13 VITALS — BP 99/63 | HR 84 | Temp 98.5°F | Resp 18 | Ht 64.96 in | Wt 138.0 lb

## 2017-09-13 DIAGNOSIS — L853 Xerosis cutis: Secondary | ICD-10-CM | POA: Diagnosis not present

## 2017-09-13 DIAGNOSIS — M7989 Other specified soft tissue disorders: Secondary | ICD-10-CM

## 2017-09-13 NOTE — Patient Instructions (Addendum)
Bump on top of foot may be due to a cyst. If not painful, no treatment needed at this time but may need to check MRI of that area if still there in next few weeks. Please return for recheck in 2 weeks  Aveeno or Eucerin for dry skin, apply twice per day and especially after bathing. Recheck in 2 weeks. If still itching, can look into other causes.   Return to the clinic or go to the nearest emergency room if any of your symptoms worsen or new symptoms occur.  Thank you for coming in today.    IF you received an x-ray today, you will receive an invoice from [facility]. Please contact [facility] at [phone] with questions or concerns regarding your invoice.   IF you received labwork today, you will receive an invoice from [facility]. Please contact [facility] at [phone] with questions or concerns regarding your invoice.   Our billing staff will not be able to assist you with questions regarding bills from these companies.  You will be contacted with the lab results as soon as they are available. The fastest way to get your results is to activate your My Chart account. Instructions are located on the last page of this paperwork. If you have not heard from us regarding the results in 2 weeks, please contact this office.

## 2017-09-13 NOTE — Progress Notes (Signed)
Subjective:  By signing my name below, I, Alicia Werner, attest that this documentation has been prepared under the direction and in the presence of Shade Flood, MD Electronically Signed: Charline Bills, ED Scribe 09/13/2017 at 10:37 AM.   Patient ID: Alicia Werner, female    DOB: 12/02/1980, 37 y.o.   MRN: [id number]  Chief Complaint  Patient presents with  . Edema    Top of left foot & Pain  . Pruritis    Heels of feet, bilateral   HPI Alicia Werner is a 37 y.o. female who presents to Primary Care at [facility] complaining of a nontender area of swelling to the top of the L foot first noticed 2-3 wks ago. Pt does yoga often but denies injury. She did notice redness and pain to the area with wearing high heels 1 wk ago but denies pain since.  Bilateral Heel Itching  Pt also reports itching to both heels. She has applied lotion to her feet without relief.  Patient Active Problem List   Diagnosis Date Noted  . Recurrent sinusitis 11/23/2016  . Cesarean delivery delivered 03/28/2014  . Allergic rhinitis due to other allergen 09/09/2012  . Other chronic allergic conjunctivitis 09/09/2012  . 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 Other atopic dermatitis and related conditions 09/09/2012   Past Medical History:  Diagnosis Date  . Allergy   . Medical history non-contributory   . Vaginal delivery 2008, 2010   Past Surgical History:  Procedure Laterality Date  . CESAREAN SECTION    . CESAREAN SECTION WITH BILATERAL TUBAL LIGATION N/A 03/28/2014   Procedure: CESAREAN SECTION WITH BILATERAL TUBAL LIGATION;  Surgeon: Marlow Baars, MD;  Location: [facility];  Service: Obstetrics;  Laterality: N/A;  . FACIAL COSMETIC SURGERY  2003  . NO PAST SURGERIES     Allergies  Allergen Reactions  . Shellfish Allergy Hives and Swelling    Patient reports facial swelling, redness, and hives after eating shellfish. Allergy testing done.   . Peanuts [Peanut Oil]   . Pineapple    Patient reports scratchy, sore throat after eating.    Prior to Admission medications   Medication Sig Start Date End Date Taking? Authorizing Provider  amoxicillin-clavulanate (AUGMENTIN) 875-125 MG tablet Take 1 tablet by mouth 2 (two) times daily. 06/06/17   Peyton Najjar, MD  fluticasone (FLONASE) 50 MCG/ACT nasal spray Place 2 sprays into both nostrils daily. 06/06/17   Peyton Najjar, MD  guaiFENesin (MUCINEX) 600 MG 12 hr tablet Take by mouth 2 (two) times daily.    [provider]  montelukast (SINGULAIR) 10 MG tablet Take 10 mg by mouth at bedtime. Reported on 08/17/2015    [provider]  triamcinolone cream (KENALOG) 0.1 % Apply 1 application topically 2 (two) times daily. 06/06/17   Peyton Najjar, MD   Social History   Socioeconomic History  . Marital status: Married    Spouse name: Not on file  . Number of children: Not on file  . Years of education: Not on file  . Highest education level: Not on file  Social Needs  . Financial resource strain: Not on file  . Food insecurity - worry: Not on file  . Food insecurity - inability: Not on file  . Transportation needs - medical: Not on file  . Transportation needs - non-medical: Not on file  Occupational History  . Not on file  Tobacco Use  . Smoking status: Never Smoker  . Smokeless  tobacco: Never Used  Substance and Sexual Activity  . Alcohol use: No    Alcohol/week: 0.0 oz  . Drug use: No  . Sexual activity: Yes  Other Topics Concern  . Not on file  Social History Narrative  . Not on file   Review of Systems  Musculoskeletal: Positive for joint swelling. Arthralgias: resolved.  Skin: Color change: resolved.      Objective:   Physical Exam  Constitutional: She is oriented to person, place, and time. She appears well-developed and well-nourished. No distress.  HENT:  Head: Normocephalic and atraumatic.  Eyes: Conjunctivae and EOM are normal.  Neck: Neck supple. No tracheal deviation  present.  Cardiovascular: Normal rate.  Pulmonary/Chest: Effort normal. No respiratory distress.  Musculoskeletal: Normal range of motion.  L foot: nontender prominence ~1 cm across over dorsum of midfoot over the cuneiforms and toward the base of second to third metatarsal. Navicular, fifth metatarsal nontender. FROM of foot and ankle.  Neurological: She is alert and oriented to person, place, and time.  Skin: Skin is warm and dry.  Pt does have dry skin on posterior heels and some scattered areas of feet.  Psychiatric: She has a normal mood and affect. Her behavior is normal.  Nursing note and vitals reviewed.  Vitals:   09/13/17 0957  BP: 99/63  Pulse: 84  Resp: 18  Temp: 98.5 F (36.9 C)  TempSrc: Oral  SpO2: 99%  Weight: 138 lb (62.6 kg)  Height: 5' 4.96" (1.65 m)   Dg Foot 2 Views Left  Result Date: 09/13/2017 CLINICAL DATA:  Nodular area over dorsum of left foot a few weeks. Nontender. EXAM: LEFT FOOT - 2 VIEW COMPARISON:  None. FINDINGS: There is no evidence of fracture or dislocation. There is no evidence of arthropathy or other focal bone abnormality. Subtle focal 1.3 cm soft tissue prominence/mass over the dorsum of the midfoot at the level of the tarsometatarsal junction on the lateral film. IMPRESSION: No focal bony abnormality. Focal nonspecific soft tissue prominence/mass measuring 1.3 cm over the dorsum of the midfoot. MRI may be helpful for further evaluation. Electronically Signed   By: Elberta Fortisaniel  Boyle M.D.   On: 09/13/2017 10:58      Assessment & Plan:  Alicia SorensonHuong T Werner is a 37 y.o. female Swelling of left foot - Plan: DG Foot 2 Views Left  - Soft tissue prominence, dorsum left foot. Does not appear to be bony, noticed recently. Differential includes cyst. Recheck in 2 weeks to decide on MRI, sooner if more painful.   Dry skin  - Recommended Aveeno or other lotion over-the-counter twice per day, especially after bathing. Recheck in 2 weeks to decide if other  testing/evaluation needed for itching of feet.  Did report episodic pain in other joints of the body, plans to return to discuss those areas further. Can discuss at follow-up visit in 2 weeks.   No orders of the defined types were placed in this encounter.  Patient Instructions   Bump on top of foot may be due to a cyst. If not painful, no treatment needed at this time but may need to check MRI of that area if still there in next few weeks. Please return for recheck in 2 weeks  Aveeno or Eucerin for dry skin, apply twice per day and especially after bathing. Recheck in 2 weeks. If still itching, can look into other causes.   Return to the clinic or go to the nearest emergency room if any of your symptoms  worsen or new symptoms occur.  Thank you for coming in today.    IF you received an x-ray today, you will receive an invoice from [facility]. Please contact [facility] at [phone] with questions or concerns regarding your invoice.   IF you received labwork today, you will receive an invoice from [facility]. Please contact [facility] at [phone] with questions or concerns regarding your invoice.   Our billing staff will not be able to assist you with questions regarding bills from these companies.  You will be contacted with the lab results as soon as they are available. The fastest way to get your results is to activate your My Chart account. Instructions are located on the last page of this paperwork. If you have not heard from [place] regarding the results in 2 weeks, please contact this office.      I personally performed the services described in this documentation, which was scribed in my presence. The recorded information has been reviewed and considered for accuracy and completeness, addended by me as needed, and agree with information above.  Signed,   [name], MD Primary Care at [facility].  09/13/17 11:28 AM

## 2017-09-27 ENCOUNTER — Encounter: Payer: Self-pay | Admitting: Family Medicine

## 2017-09-27 ENCOUNTER — Other Ambulatory Visit: Payer: Self-pay

## 2017-09-27 ENCOUNTER — Ambulatory Visit (INDEPENDENT_AMBULATORY_CARE_PROVIDER_SITE_OTHER): Payer: BLUE CROSS/BLUE SHIELD | Admitting: Family Medicine

## 2017-09-27 VITALS — BP 100/68 | HR 78 | Temp 98.0°F | Resp 16 | Ht 64.0 in | Wt 133.6 lb

## 2017-09-27 DIAGNOSIS — R2242 Localized swelling, mass and lump, left lower limb: Secondary | ICD-10-CM

## 2017-09-27 NOTE — Patient Instructions (Addendum)
  I will order MRI to evaluate the area on your foot further. Depending on those results, I may refer you to foot specialist.   Dry skin looks much better.   Let me know if there are questions.    IF you received an x-ray today, you will receive an invoice from [facility]. Please contact [facility] at [phone] with questions or concerns regarding your invoice.   IF you received labwork today, you will receive an invoice from [facility]. Please contact [facility] at [phone] with questions or concerns regarding your invoice.   Our billing staff will not be able to assist you with questions regarding bills from these companies.  You will be contacted with the lab results as soon as they are available. The fastest way to get your results is to activate your My Chart account. Instructions are located on the last page of this paperwork. If you have not heard from us regarding the results in 2 weeks, please contact this office.

## 2017-09-27 NOTE — Progress Notes (Signed)
Subjective:  By signing my name below, I, Essence Howell, attest that this documentation has been prepared under the direction and in the presence of Norberto Sorenson, MD Electronically Signed: Charline Bills, ED Scribe 09/27/2017 at 8:55 AM.   Patient ID: Alicia Werner, female    DOB: 1981-01-05, 36 y.o.   MRN: [id number]  Chief Complaint  Patient presents with  . Foot Pain    follow-up from OV on 09/13/2017 of left foot swellinfg and pain. Pt states there is not much pain anymore but still some swelling.    HPI Alicia Werner is a 37 y.o. female who presents to Primary Care at [facility] for f/u on L foot swelling. Seen 3/5. Swelling on dorsum of L foot for 2-3 wks. No known injury. Had soft tissue prominence ~1.3 cm on XR. Option of MRI for further eval. Also treated for dry skin. See last visit.   Pt reports that swelling has not improved, in fact, she has noticed another swollen area to the top of her L foot. Denies pain to the area. Dry skin has improved.  Patient Active Problem List   Diagnosis Date Noted  . Recurrent sinusitis 11/23/2016  . Cesarean delivery delivered 03/28/2014  . Allergic rhinitis due to other allergen 09/09/2012  . Other chronic allergic conjunctivitis 09/09/2012  . 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 Other atopic dermatitis and related conditions 09/09/2012   Past Medical History:  Diagnosis Date  . Allergy   . Medical history non-contributory   . Vaginal delivery 2008, 2010   Past Surgical History:  Procedure Laterality Date  . CESAREAN SECTION    . CESAREAN SECTION WITH BILATERAL TUBAL LIGATION N/A 03/28/2014   Procedure: CESAREAN SECTION WITH BILATERAL TUBAL LIGATION;  Surgeon: Marlow Baars, MD;  Location: [facility];  Service: Obstetrics;  Laterality: N/A;  . FACIAL COSMETIC SURGERY  2003  . NO PAST SURGERIES     Allergies  Allergen Reactions  . Shellfish Allergy Hives and Swelling    Patient reports facial swelling, redness, and hives after  eating shellfish. Allergy testing done.   . Peanuts [Peanut Oil]   . Pineapple     Patient reports scratchy, sore throat after eating.    Prior to Admission medications   Medication Sig Start Date End Date Taking? Authorizing Provider  fluticasone (FLONASE) 50 MCG/ACT nasal spray Place 2 sprays into both nostrils daily. 06/06/17  Yes Peyton Najjar, MD  guaiFENesin (MUCINEX) 600 MG 12 hr tablet Take by mouth 2 (two) times daily.   Yes [provider]  montelukast (SINGULAIR) 10 MG tablet Take 10 mg by mouth at bedtime. Reported on 08/17/2015   Yes [provider]  triamcinolone cream (KENALOG) 0.1 % Apply 1 application topically 2 (two) times daily. 06/06/17  Yes Peyton Najjar, MD   Social History   Socioeconomic History  . Marital status: Married    Spouse name: Not on file  . Number of children: Not on file  . Years of education: Not on file  . Highest education level: Not on file  Social Needs  . Financial resource strain: Not on file  . Food insecurity - worry: Not on file  . Food insecurity - inability: Not on file  . Transportation needs - medical: Not on file  . Transportation needs - non-medical: Not on file  Occupational History  . Not on file  Tobacco Use  . Smoking status: Never Smoker  . Smokeless tobacco: Never Used  Substance and Sexual Activity  . Alcohol use: No    Alcohol/week: 0.0 oz  . Drug use: No  . Sexual activity: Yes  Other Topics Concern  . Not on file  Social History Narrative  . Not on file   Review of Systems  Musculoskeletal: Positive for joint swelling. Negative for arthralgias and myalgias.  Skin: Negative for rash and wound.      Objective:   Physical Exam  Constitutional: She is oriented to person, place, and time. She appears well-developed and well-nourished. No distress.  HENT:  Head: Normocephalic and atraumatic.  Eyes: Conjunctivae and EOM are normal.  Neck: Neck supple. No tracheal deviation present.    Cardiovascular: Normal rate.  Pulmonary/Chest: Effort normal. No respiratory distress.  Musculoskeletal: Normal range of motion.  Neurological: She is alert and oriented to person, place, and time.  Skin: Skin is warm and dry.  Soft tissue prominence along dorsal mid foot ~1-1.5 cm, minimallly mobile. Similar area just proximal ~5-6 cm, firm rounded area. Cap refill less than 1 sec at toes.  Psychiatric: She has a normal mood and affect. Her behavior is normal.  Nursing note and vitals reviewed.  Vitals:   09/27/17 0834  BP: 100/68  Pulse: 78  Resp: 16  Temp: 98 F (36.7 C)  TempSrc: Oral  SpO2: 100%  Weight: 133 lb 9.6 oz (60.6 kg)  Height: 5\' 4"  (1.626 m)      Assessment & Plan:  Alicia Werner is a 37 y.o. female Mass of left foot - Plan: MR FOOT LEFT WO CONTRAST cyst versus soft tissue mass, no known injury. Now with possible new lesion proximal to previous area.   - schedule MRI to evaluate for cyst versus mass then likely podiatry or foot specialist eval.   No orders of the defined types were placed in this encounter.  Patient Instructions    I will order MRI to evaluate the area on your foot further. Depending on those results, I may refer you to foot specialist.   Dry skin looks much better.   Let me know if there are questions.    IF you received an x-ray today, you will receive an invoice from [facility]. Please contact [facility] at [phone] with questions or concerns regarding your invoice.   IF you received labwork today, you will receive an invoice from [facility]. Please contact [facility] at [phone] with questions or concerns regarding your invoice.   Our billing staff will not be able to assist you with questions regarding bills from these companies.  You will be contacted with the lab results as soon as they are available. The fastest way to get your results is to activate your My Chart account. Instructions are located on the  last page of this paperwork. If you have not heard from us regarding the results in 2 weeks, please contact this office.       I personally performed the services described in this documentation, which was scribed in my presence. The recorded information has been reviewed and considered for accuracy and completeness, addended by me as needed, and agree with information above.  Signed,   Meredith StaggersJeffrey Cabria Micalizzi, MD Primary Care at [facility].  09/27/17 9:01 AM

## 2017-10-05 ENCOUNTER — Telehealth: Payer: Self-pay | Admitting: Family Medicine

## 2017-10-05 DIAGNOSIS — R2242 Localized swelling, mass and lump, left lower limb: Secondary | ICD-10-CM

## 2017-10-05 NOTE — Telephone Encounter (Signed)
[facility] sent a message back saying they need pt mri order changed to with and without contrast and not just without. I have already received prior auth once the order is changed I will call to get the auth changed. Thanks  Please advise

## 2017-10-05 NOTE — Telephone Encounter (Signed)
Order changed.

## 2017-10-11 ENCOUNTER — Other Ambulatory Visit: Payer: BLUE CROSS/BLUE SHIELD

## 2017-12-06 ENCOUNTER — Encounter: Payer: Self-pay | Admitting: Family Medicine

## 2017-12-08 ENCOUNTER — Ambulatory Visit: Payer: BLUE CROSS/BLUE SHIELD | Admitting: Family Medicine

## 2019-06-20 IMAGING — DX DG FOOT 2V*L*
2 series · 2 of 2 positions shown · non-contrast
Comparison: None.

CLINICAL DATA: Nodular area over dorsum of left foot a few weeks.
Nontender.

EXAM:
LEFT FOOT - 2 VIEW

[foot ap]
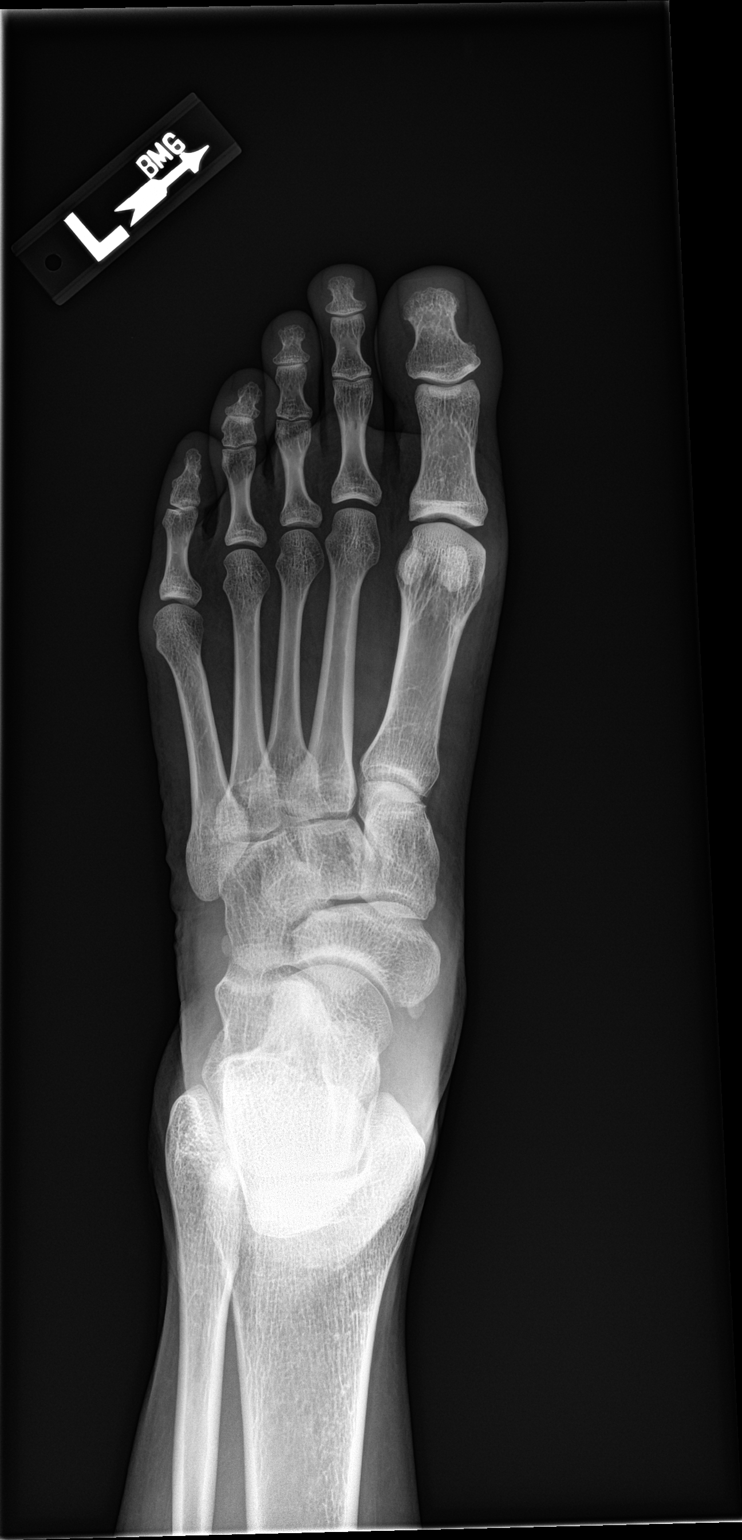

[foot lat]
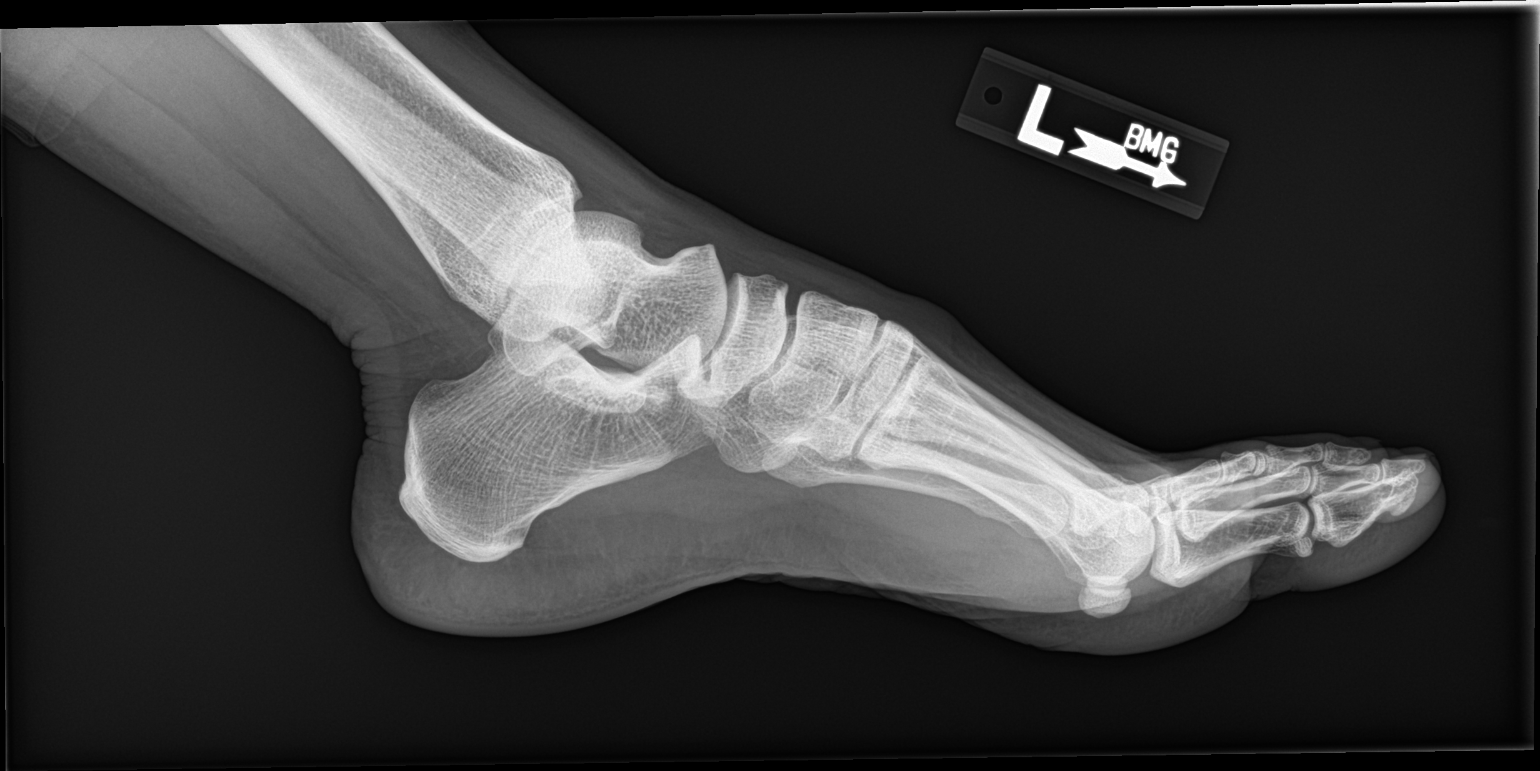

[2 of 2 positions shown; findings below may reference images not displayed]

FINDINGS: There is no evidence of fracture or dislocation. There is no
evidence of arthropathy or other focal bone abnormality. Subtle
focal 1.3 cm soft tissue prominence/mass over the dorsum of the
midfoot at the level of the tarsometatarsal junction on the lateral
film.
IMPRESSION: No focal bony abnormality.

Focal nonspecific soft tissue prominence/mass measuring 1.3 cm over
the dorsum of the midfoot. MRI may be helpful for further
evaluation.

## 2020-06-30 ENCOUNTER — Encounter: Payer: Self-pay | Admitting: Family Medicine

## 2020-06-30 ENCOUNTER — Other Ambulatory Visit: Payer: Self-pay

## 2020-06-30 ENCOUNTER — Telehealth (INDEPENDENT_AMBULATORY_CARE_PROVIDER_SITE_OTHER): Payer: 59 | Admitting: Family Medicine

## 2020-06-30 VITALS — Ht 63.0 in | Wt 130.0 lb

## 2020-06-30 DIAGNOSIS — L299 Pruritus, unspecified: Secondary | ICD-10-CM | POA: Diagnosis not present

## 2020-06-30 DIAGNOSIS — J329 Chronic sinusitis, unspecified: Secondary | ICD-10-CM

## 2020-06-30 MED ORDER — AMOXICILLIN-POT CLAVULANATE 875-125 MG PO TABS: 1.0000 | ORAL_TABLET | Freq: Two times a day (BID) | ORAL | 0 refills | Status: AC

## 2020-06-30 MED ORDER — TRIAMCINOLONE ACETONIDE 0.1 % EX CREA: 1.0000 "application " | TOPICAL_CREAM | Freq: Two times a day (BID) | CUTANEOUS | 1 refills | Status: DC

## 2020-06-30 NOTE — Patient Instructions (Signed)

## 2020-06-30 NOTE — Progress Notes (Signed)
Virtual Visit Note  I connected with patient on 06/30/20 at 1605 by telephone due to unable to work Epic video visit and verified that I am speaking with the correct person using two identifiers. Alicia Werner is currently located at home and no family members are currently with them during visit. The provider, Azalee Course Asti Mackley, FNP is located in their office at time of visit.  I discussed the limitations, risks, security and privacy concerns of performing an evaluation and management service by telephone and the availability of in person appointments. I also discussed with the patient that there may be a patient responsible charge related to this service. The patient expressed understanding and agreed to proceed.   I provided 20 minutes of non-face-to-face time during this encounter.  Chief Complaint  Patient presents with  . Sore Throat    X 2 weeks , cough, stuffy nose and congestion- taking tylenol and allergy medicine    HPI  Cough and Stuffy nose for 2 weeks Gets sinus infections yearly Generalized myalgia Denies sick contacts Denies fevers Has been using OTC conservative management?   Allergies  Allergen Reactions  . Shellfish Allergy Hives and Swelling    Patient reports facial swelling, redness, and hives after eating shellfish. Allergy testing done.   . Aspirin     Stomach pain   . Peanuts [Peanut Oil]   . Pineapple     Patient reports scratchy, sore throat after eating.     Prior to Admission medications   Medication Sig Start Date End Date Taking? Authorizing Provider  fexofenadine-pseudoephedrine (ALLEGRA-D 24) 180-240 MG 24 hr tablet Take 1 tablet by mouth daily.   Yes [provider]  fluticasone (FLONASE) 50 MCG/ACT nasal spray Place 2 sprays into both nostrils daily. 06/06/17  Yes Peyton Najjar, MD  guaiFENesin (MUCINEX) 600 MG 12 hr tablet Take by mouth 2 (two) times daily. Patient not taking: Reported on 06/30/2020    [provider]   montelukast (SINGULAIR) 10 MG tablet Take 10 mg by mouth at bedtime. Reported on 08/17/2015 Patient not taking: Reported on 06/30/2020    [provider]  triamcinolone cream (KENALOG) 0.1 % Apply 1 application topically 2 (two) times daily. Patient not taking: Reported on 06/30/2020 06/06/17   Peyton Najjar, MD    Past Medical History:  Diagnosis Date  . Allergy   . Medical history non-contributory   . Vaginal delivery 2008, 2010    Past Surgical History:  Procedure Laterality Date  . CESAREAN SECTION    . CESAREAN SECTION WITH BILATERAL TUBAL LIGATION N/A 03/28/2014   Procedure: CESAREAN SECTION WITH BILATERAL TUBAL LIGATION;  Surgeon: Marlow Baars, MD;  Location: [facility];  Service: Obstetrics;  Laterality: N/A;  . FACIAL COSMETIC SURGERY  2003  . NO PAST SURGERIES      Social History   Tobacco Use  . Smoking status: Never Smoker  . Smokeless tobacco: Never Used  Substance Use Topics  . Alcohol use: No    Alcohol/week: 0.0 standard drinks    Family History  Problem Relation Age of Onset  . Asthma Son   . Cancer Maternal Uncle        Stomach CA    Review of Systems  Constitutional: Negative for chills, fever and malaise/fatigue.  HENT: Positive for congestion and sinus pain. Negative for ear pain and sore throat.   Eyes: Negative for blurred vision and double vision.  Respiratory: Positive for cough. Negative for sputum production, shortness of  breath and wheezing.   Cardiovascular: Negative for chest pain and palpitations.  Musculoskeletal: Positive for myalgias.    Objective  Constitutional:      General: She is not in acute distress.    Appearance: Normal appearance. She is not ill-appearing.   Pulmonary:     Effort: Pulmonary effort is normal. No respiratory distress.  Neurological:     Mental Status: She is alert and oriented to person, place, and time.  Psychiatric:        Mood and Affect: Mood normal.        Behavior: Behavior normal.      ASSESSMENT and PLAN  Problem List Items Addressed This Visit      Respiratory   Recurrent sinusitis - Primary   Relevant Medications   fexofenadine-pseudoephedrine (ALLEGRA-D 24) 180-240 MG 24 hr tablet   amoxicillin-clavulanate (AUGMENTIN) 875-125 MG tablet bid x 5 days Continue with conservative management Continue with Nasal Sprays       Other Visit Diagnoses    Itching       Relevant Medications   triamcinolone (KENALOG) 0.1 %       Return if symptoms worsen or fail to improve, for next scheduled appointment.    The above assessment and management plan was discussed with the patient. The patient verbalized understanding of and has agreed to the management plan. Patient is aware to call the clinic if symptoms persist or worsen. Patient is aware when to return to the clinic for a follow-up visit. Patient educated on when it is appropriate to go to the emergency department.     Macario Carls Jair Lindblad, FNP-BC [facility] [address] [place], [place] [address] Ph.  [phone] Fax [phone]

## 2020-10-01 ENCOUNTER — Ambulatory Visit (HOSPITAL_COMMUNITY)
Admission: EM | Admit: 2020-10-01 | Discharge: 2020-10-01 | Disposition: A | Discharge status: Home / Self Care | Payer: 59 | Attending: Family Medicine | Admitting: Family Medicine

## 2020-10-01 ENCOUNTER — Encounter (HOSPITAL_COMMUNITY): Payer: Self-pay | Admitting: Family Medicine

## 2020-10-01 ENCOUNTER — Other Ambulatory Visit: Payer: Self-pay

## 2020-10-01 DIAGNOSIS — J0100 Acute maxillary sinusitis, unspecified: Secondary | ICD-10-CM | POA: Diagnosis not present

## 2020-10-01 DIAGNOSIS — L299 Pruritus, unspecified: Secondary | ICD-10-CM

## 2020-10-01 DIAGNOSIS — L309 Dermatitis, unspecified: Secondary | ICD-10-CM

## 2020-10-01 DIAGNOSIS — J302 Other seasonal allergic rhinitis: Secondary | ICD-10-CM

## 2020-10-01 MED ORDER — AMOXICILLIN-POT CLAVULANATE 875-125 MG PO TABS: 1.0000 | ORAL_TABLET | Freq: Two times a day (BID) | ORAL | 0 refills | Status: DC

## 2020-10-01 MED ORDER — TRIAMCINOLONE ACETONIDE 0.1 % EX CREA: 1.0000 "application " | TOPICAL_CREAM | Freq: Two times a day (BID) | CUTANEOUS | 2 refills | Status: DC

## 2020-10-01 NOTE — ED Triage Notes (Signed)
Pt states that she has nasal congestion, headaches, facial pain and cough. Pt states that her sx started one week ago.

## 2020-10-01 NOTE — ED Provider Notes (Addendum)
[facility]   [id number] 10/01/20 Arrival Time: 1232  ASSESSMENT & PLAN:  1. Acute non-recurrent maxillary sinusitis   2. Seasonal allergies   3. Itching   4. Eczema, unspecified type     Meds ordered this encounter  Medications  . triamcinolone (KENALOG) 0.1 %    Sig: Apply 1 application topically 2 (two) times daily.    Dispense:  30 g    Refill:  2  . amoxicillin-clavulanate (AUGMENTIN) 875-125 MG tablet    Sig: Take 1 tablet by mouth every 12 (twelve) hours.    Dispense:  20 tablet    Refill:  0    Discussed typical duration of symptoms. OTC symptom care as needed. Continue OTC allergy medicine.   Follow-up Information    Shade Flood, MD.   Specialties: Family Medicine, Sports Medicine Why: As needed. Contact information: [address] [place] [address] [phone]               Reviewed expectations re: course of current medical issues. Questions answered. Outlined signs and symptoms indicating need for more acute intervention. Patient verbalized understanding. After Visit Summary given.   SUBJECTIVE: History from: patient.  Alicia Werner is a 40 y.o. female who presents with complaint of nasal congestion, post-nasal drainage, and sinus pain. Occasional cough without SOB. Onset gradual, approx 2 w ago. now with significant sinus pressure/pain; maxillary. Fever: absent. Overall normal PO intake without n/v. OTC treatment: allergy medication; questions mild and temp relief. Seasonal allergies: yes.  Social History   Tobacco Use  Smoking Status Never Smoker  Smokeless Tobacco Never Used   Also with eczema exacerbation; bilat forearms; several weeks; much itching. Has used steroid cream in past with relief.  OBJECTIVE:  Vitals:   10/01/20 1246  BP: (!) 118/51  Pulse: 84  Resp: 18  Temp: 98.8 F (37.1 C)  TempSrc: Oral  SpO2: 100%     General appearance: alert; no distress HEENT: nasal congestion; clear runny nose;  throat irritation secondary to post-nasal drainage; bilateral maxillary tenderness to palpation; turbinates boggy Neck: supple without LAD; trachea midline Lungs: unlabored respirations, symmetrical air entry; cough: mild, dry; no respiratory distress; CTAB Skin: warm and dry; eczema changes over bilat forearms; no signs of bacterial skin infection Psychological: alert and cooperative; normal mood and affect  Allergies  Allergen Reactions  . Shellfish Allergy Hives and Swelling    Patient reports facial swelling, redness, and hives after eating shellfish. Allergy testing done.   . Aspirin     Stomach pain   . Peanuts [Peanut Oil]   . Pineapple     Patient reports scratchy, sore throat after eating.     Past Medical History:  Diagnosis Date  . Allergy   . Medical history non-contributory   . Vaginal delivery 2008, 2010   Family History  Problem Relation Age of Onset  . Asthma Son   . Cancer Maternal Uncle        Stomach CA   Social History   Socioeconomic History  . Marital status: Married    Spouse name: Not on file  . Number of children: Not on file  . Years of education: Not on file  . Highest education level: Not on file  Occupational History  . Not on file  Tobacco Use  . Smoking status: Never Smoker  . Smokeless tobacco: Never Used  Substance and Sexual Activity  . Alcohol use: No    Alcohol/week: 0.0 standard drinks  . Drug  use: No  . Sexual activity: Yes  Other Topics Concern  . Not on file  Social History Narrative  . Not on file   Social Determinants of Health   Financial Resource Strain: Not on file  Food Insecurity: Not on file  Transportation Needs: Not on file  Physical Activity: Not on file  Stress: Not on file  Social Connections: Not on file  Intimate Partner Violence: Not on file            Mardella Layman, MD 10/01/20 1320    Mardella Layman, MD 10/01/20 1321

## 2021-07-20 ENCOUNTER — Other Ambulatory Visit: Payer: Self-pay

## 2021-07-20 ENCOUNTER — Ambulatory Visit
Admission: EM | Admit: 2021-07-20 | Discharge: 2021-07-20 | Disposition: A | Discharge status: Home / Self Care | Payer: 59 | Attending: Urgent Care | Admitting: Urgent Care

## 2021-07-20 DIAGNOSIS — R070 Pain in throat: Secondary | ICD-10-CM | POA: Diagnosis not present

## 2021-07-20 DIAGNOSIS — J069 Acute upper respiratory infection, unspecified: Secondary | ICD-10-CM

## 2021-07-20 MED ORDER — PSEUDOEPHEDRINE HCL 30 MG PO TABS
30.0000 mg | ORAL_TABLET | Freq: Three times a day (TID) | ORAL | 0 refills | Status: DC | PRN
Start: 2021-07-20 — End: 2021-08-06

## 2021-07-20 MED ORDER — CETIRIZINE HCL 10 MG PO TABS: 10.0000 mg | ORAL_TABLET | Freq: Every day | ORAL | 0 refills | Status: DC

## 2021-07-20 MED ORDER — BENZONATATE 100 MG PO CAPS: 100.0000 mg | ORAL_CAPSULE | Freq: Three times a day (TID) | ORAL | 0 refills | Status: DC | PRN

## 2021-07-20 MED ORDER — PROMETHAZINE-DM 6.25-15 MG/5ML PO SYRP: 5.0000 mL | ORAL_SOLUTION | Freq: Every evening | ORAL | 0 refills | Status: DC | PRN

## 2021-07-20 NOTE — ED Triage Notes (Signed)
Pt presents to the office for sore throat and cough x 4 days.

## 2021-07-20 NOTE — ED Provider Notes (Signed)
[facility]   MRN: [id number] DOB: [birth year]  Subjective:   Alicia Werner is a 41 y.o. female presenting for 4-day history of acute onset runny nose, throat pain, cough, throat congestion.  No fever, body aches, ear pain, sinus pain, painful swallowing, chest pain, shortness of breath, wheezing, nausea, vomiting, abdominal pain.  Does not want any respiratory testing.  Has not taken medications for relief.  She does have a history of allergies but does not take anything for this.  No current facility-administered medications for this encounter.  Current Outpatient Medications:    amoxicillin-clavulanate (AUGMENTIN) 875-125 MG tablet, Take 1 tablet by mouth every 12 (twelve) hours., Disp: 20 tablet, Rfl: 0   fexofenadine-pseudoephedrine (ALLEGRA-D 24) 180-240 MG 24 hr tablet, Take 1 tablet by mouth daily., Disp: , Rfl:    fluticasone (FLONASE) 50 MCG/ACT nasal spray, Place 2 sprays into both nostrils daily., Disp: 16 g, Rfl: 1   triamcinolone (KENALOG) 0.1 %, Apply 1 application topically 2 (two) times daily., Disp: 30 g, Rfl: 2   Allergies  Allergen Reactions   Shellfish Allergy Hives and Swelling    Patient reports facial swelling, redness, and hives after eating shellfish. Allergy testing done.    Aspirin     Stomach pain    Peanuts [Peanut Oil]    Pineapple     Patient reports scratchy, sore throat after eating.     Past Medical History:  Diagnosis Date   Allergy    Medical history non-contributory    Vaginal delivery 2008, 2010     Past Surgical History:  Procedure Laterality Date   CESAREAN SECTION     CESAREAN SECTION WITH BILATERAL TUBAL LIGATION N/A 03/28/2014   Procedure: CESAREAN SECTION WITH BILATERAL TUBAL LIGATION;  Surgeon: Jerelyn Charles, MD;  Location: [facility];  Service: Obstetrics;  Laterality: N/A;   FACIAL COSMETIC SURGERY  2003   NO PAST SURGERIES      Family History  Problem Relation Age of Onset   Asthma Son    Cancer Maternal  Uncle        Stomach CA    Social History   Tobacco Use   Smoking status: Never   Smokeless tobacco: Never  Substance Use Topics   Alcohol use: No    Alcohol/week: 0.0 standard drinks   Drug use: No    ROS   Objective:   Vitals: BP 103/70 (BP Location: Left Arm)    Pulse 72    Temp 98.6 F (37 C) (Oral)    Resp 16    LMP 06/25/2021    SpO2 99%   Physical Exam Constitutional:      General: She is not in acute distress.    Appearance: Normal appearance. She is well-developed and normal weight. She is not ill-appearing, toxic-appearing or diaphoretic.  HENT:     Head: Normocephalic and atraumatic.     Right Ear: Tympanic membrane, ear canal and external ear normal. No drainage or tenderness. No middle ear effusion. There is no impacted cerumen. Tympanic membrane is not erythematous.     Left Ear: Tympanic membrane, ear canal and external ear normal. No drainage or tenderness.  No middle ear effusion. There is no impacted cerumen. Tympanic membrane is not erythematous.     Nose: Nose normal. No congestion or rhinorrhea.     Mouth/Throat:     Mouth: Mucous membranes are moist. No oral lesions.     Pharynx: No pharyngeal swelling, oropharyngeal exudate, posterior oropharyngeal erythema  or uvula swelling.     Tonsils: No tonsillar exudate or tonsillar abscesses.  Eyes:     General: No scleral icterus.       Right eye: No discharge.        Left eye: No discharge.     Extraocular Movements: Extraocular movements intact.     Right eye: Normal extraocular motion.     Left eye: Normal extraocular motion.     Conjunctiva/sclera: Conjunctivae normal.  Cardiovascular:     Rate and Rhythm: Normal rate and regular rhythm.     Pulses: Normal pulses.     Heart sounds: Normal heart sounds. No murmur heard.   No friction rub. No gallop.  Pulmonary:     Effort: Pulmonary effort is normal. No respiratory distress.     Breath sounds: Normal breath sounds. No stridor. No wheezing, rhonchi  or rales.  Musculoskeletal:     Cervical back: Normal range of motion and neck supple.  Lymphadenopathy:     Cervical: No cervical adenopathy.  Skin:    General: Skin is warm and dry.     Findings: No rash.  Neurological:     General: No focal deficit present.     Mental Status: She is alert and oriented to person, place, and time.  Psychiatric:        Mood and Affect: Mood normal.        Behavior: Behavior normal.        Thought Content: Thought content normal.    Assessment and Plan :   PDMP not reviewed this encounter.  1. Viral URI with cough   2. Throat pain    Patient declined any testing. Deferred imaging given clear cardiopulmonary exam, hemodynamically stable vital signs. Suspect viral URI, viral syndrome. Physical exam findings reassuring and vital signs stable for discharge. Advised supportive care, offered symptomatic relief. Counseled patient on potential for adverse effects with medications prescribed/recommended today, ER and return-to-clinic precautions discussed, patient verbalized understanding.     Jaynee Eagles, [place] 07/20/21 [phone]

## 2021-08-06 ENCOUNTER — Encounter: Payer: Self-pay | Admitting: Family Medicine

## 2021-08-06 ENCOUNTER — Other Ambulatory Visit: Payer: Self-pay

## 2021-08-06 ENCOUNTER — Telehealth (INDEPENDENT_AMBULATORY_CARE_PROVIDER_SITE_OTHER): Payer: 59 | Admitting: Family Medicine

## 2021-08-06 DIAGNOSIS — R519 Headache, unspecified: Secondary | ICD-10-CM | POA: Diagnosis not present

## 2021-08-06 DIAGNOSIS — R059 Cough, unspecified: Secondary | ICD-10-CM | POA: Diagnosis not present

## 2021-08-06 DIAGNOSIS — R0981 Nasal congestion: Secondary | ICD-10-CM

## 2021-08-06 MED ORDER — PROMETHAZINE-DM 6.25-15 MG/5ML PO SYRP: 5.0000 mL | ORAL_SOLUTION | Freq: Every evening | ORAL | 0 refills | Status: DC | PRN

## 2021-08-06 MED ORDER — AMOXICILLIN-POT CLAVULANATE 875-125 MG PO TABS: 1.0000 | ORAL_TABLET | Freq: Two times a day (BID) | ORAL | 0 refills | Status: DC

## 2021-08-06 NOTE — Progress Notes (Signed)
Virtual Visit via Telephone Note  I connected with Alicia Werner on 08/06/21 at  4:20 PM EST by telephone and verified that I am speaking with the correct person using two identifiers. Declined interpreter.    I discussed the limitations of performing an evaluation and management service by telephone and requested permission for a phone visit. The patient expressed understanding and agreed to proceed.  Location patient:  [place] Location provider: work or home office Participants present for the call: patient, provider Patient did not have a visit with me in the prior 7 days to address this/these issue(s).   History of Present Illness:  Acute telemedicine visit for a cough: -Onset:started a few weeks ago with cold -was seen a few weeks ago in [facility] - was given cough medication which help the cough,but is almost out and is doing worse  -Symptoms include: nasal congestion, cough, now with sinus discomfort, yellowish green mucus -Denies: fever, CP, SOB, NVD -Pertinent past medical history: see below, reports hx of sinusitis and feels she has bacterial sinus infection -Pertinent medication allergies:  Allergies  Allergen Reactions   Shellfish Allergy Hives and Swelling    Patient reports facial swelling, redness, and hives after eating shellfish. Allergy testing done.    Aspirin     Stomach pain    Peanuts [Peanut Oil]    Pineapple     Patient reports scratchy, sore throat after eating.   -COVID-19 vaccine status: Immunization History  Administered Date(s) Administered   Influenza,inj,Quad PF,6+ Mos 03/30/2014   Tdap 03/29/2014      Past Medical History:  Diagnosis Date   Allergy    Medical history non-contributory    Vaginal delivery 2008, 2010    Current Outpatient Medications on File Prior to Visit  Medication Sig Dispense Refill   benzonatate (TESSALON) 100 MG capsule Take 1-2 capsules (100-200 mg total) by mouth 3 (three) times daily as needed for cough. 60 capsule 0    cetirizine (ZYRTEC ALLERGY) 10 MG tablet Take 1 tablet (10 mg total) by mouth daily. 30 tablet 0   fluticasone (FLONASE) 50 MCG/ACT nasal spray Place 2 sprays into both nostrils daily. 16 g 1   [DISCONTINUED] montelukast (SINGULAIR) 10 MG tablet Take 10 mg by mouth at bedtime. Reported on 08/17/2015 (Patient not taking: No sig reported)     No current facility-administered medications on file prior to visit.    Observations/Objective: Patient sounds cheerful and well on the phone. I do not appreciate any SOB. Speech and thought processing are grossly intact. Patient reported vitals:  Assessment and Plan:  Nasal congestion  Facial discomfort  Cough, unspecified type  -we discussed possible serious and likely etiologies, options for evaluation and workup, limitations of telemedicine visit vs in person visit, treatment, treatment risks and precautions. Pt prefers to treat via telemedicine empirically rather than in person at this moment. Query sinusitis vs other. She opted for empiric trial of Augmentin and a refill on the cough Rx. . Advised to seek prompt in person care if worsening, new symptoms arise, or if is not improving with treatment as expected per our conversation of expected course.    I discussed the assessment and treatment plan with the patient. The patient was provided an opportunity to ask questions and all were answered. The patient agreed with the plan and demonstrated an understanding of the instructions.    Follow Up Instructions:  I did not refer this patient for an OV with me in the next 24 hours for  this/these issue(s).  I discussed the assessment and treatment plan with the patient. The patient was provided an opportunity to ask questions and all were answered. The patient agreed with the plan and demonstrated an understanding of the instructions.   I spent 17 minutes on the date of this visit in the care of this patient. See summary of tasks completed to  properly care for this patient in the detailed notes above which also included counseling of above, review of PMH, medications, allergies, evaluation of the patient and ordering and/or  instructing patient on testing and care options.     Terressa Koyanagi, DO

## 2021-08-06 NOTE — Patient Instructions (Signed)
-  I sent the medication(s) we discussed to your pharmacy: Meds ordered this encounter  Medications   amoxicillin-clavulanate (AUGMENTIN) 875-125 MG tablet    Sig: Take 1 tablet by mouth 2 (two) times daily.    Dispense:  20 tablet    Refill:  0   promethazine-dextromethorphan (PROMETHAZINE-DM) 6.25-15 MG/5ML syrup    Sig: Take 5 mLs by mouth at bedtime as needed for cough.    Dispense:  100 mL    Refill:  0     I hope you are feeling better soon!  Seek in person care promptly if your symptoms worsen, new concerns arise or you are not improving with treatment.  It was nice to meet you today. I help [facility] out with telemedicine visits on Tuesdays and Thursdays and am happy to help if you need a virtual follow up visit on those days. Otherwise, if you have any concerns or questions following this visit please schedule a follow up visit with your Primary Care office or seek care at a local urgent care clinic to avoid delays in care

## 2021-10-12 ENCOUNTER — Encounter: Payer: Self-pay | Admitting: Obstetrics and Gynecology

## 2021-10-12 ENCOUNTER — Ambulatory Visit (INDEPENDENT_AMBULATORY_CARE_PROVIDER_SITE_OTHER): Payer: 59 | Admitting: Obstetrics and Gynecology

## 2021-10-12 ENCOUNTER — Other Ambulatory Visit (HOSPITAL_COMMUNITY)
Admission: RE | Admit: 2021-10-12 | Discharge: 2021-10-12 | Disposition: A | Discharge status: Home / Self Care | Payer: 59 | Source: Ambulatory Visit | Attending: Obstetrics and Gynecology | Admitting: Obstetrics and Gynecology

## 2021-10-12 VITALS — BP 106/64 | HR 73 | Ht 64.0 in | Wt 135.0 lb

## 2021-10-12 DIAGNOSIS — Z01419 Encounter for gynecological examination (general) (routine) without abnormal findings: Secondary | ICD-10-CM

## 2021-10-12 NOTE — Progress Notes (Signed)
Subjective:  ?  ? Alicia Werner is a 41 y.o. female P3 with LMP 09/15/21 and BMI 23 who is here for a comprehensive physical exam. The patient reports no problems. She reports a monthly 3-day period. She denies pelvic pain or abnormal discharge. She is sexually active using BTL for contraception. Patient is without any complaints ? ?Past Medical History:  ?Diagnosis Date  ? Allergy   ? Medical history non-contributory   ? Vaginal delivery 2008, 2010  ? ?Past Surgical History:  ?Procedure Laterality Date  ? CESAREAN SECTION    ? CESAREAN SECTION WITH BILATERAL TUBAL LIGATION N/A 03/28/2014  ? Procedure: CESAREAN SECTION WITH BILATERAL TUBAL LIGATION;  Surgeon: Marlow Baars, MD;  Location: [facility];  Service: Obstetrics;  Laterality: N/A;  ? FACIAL COSMETIC SURGERY  2003  ? NO PAST SURGERIES    ? ?Family History  ?Problem Relation Age of Onset  ? Asthma Son   ? Cancer Maternal Uncle   ?     Stomach CA  ? ? ? ?Social History  ? ?Socioeconomic History  ? Marital status: Married  ?  Spouse name: Not on file  ? Number of children: Not on file  ? Years of education: Not on file  ? Highest education level: Not on file  ?Occupational History  ? Not on file  ?Tobacco Use  ? Smoking status: Never  ? Smokeless tobacco: Never  ?Substance and Sexual Activity  ? Alcohol use: No  ?  Alcohol/week: 0.0 standard drinks  ? Drug use: No  ? Sexual activity: Yes  ?Other Topics Concern  ? Not on file  ?Social History Narrative  ? Not on file  ? ?Social Determinants of Health  ? ?Financial Resource Strain: Not on file  ?Food Insecurity: Not on file  ?Transportation Needs: Not on file  ?Physical Activity: Not on file  ?Stress: Not on file  ?Social Connections: Not on file  ?Intimate Partner Violence: Not on file  ? ?Health Maintenance  ?Topic Date Due  ? PAP SMEAR-Modifier  09/10/2018  ? COVID-19 Vaccine (2 - Pfizer series) 11/04/2021 (Originally 11/12/2019)  ? INFLUENZA VACCINE  02/09/2022  ? TETANUS/TDAP  03/29/2024  ? Hepatitis C Screening   Completed  ? HIV Screening  Completed  ? HPV VACCINES  Aged Out  ? ? ?  ? ?Review of Systems ?Pertinent items noted in HPI and remainder of comprehensive ROS otherwise negative.  ? ?Objective:  ?Blood pressure 106/64, pulse 73, height 5\' 4"  (1.626 m), weight 135 lb (61.2 kg), last menstrual period 09/15/2021. ? ? GENERAL: Well-developed, well-nourished female in no acute distress.  ?HEENT: Normocephalic, atraumatic. Sclerae anicteric.  ?NECK: Supple. Normal thyroid.  ?LUNGS: Clear to auscultation bilaterally.  ?HEART: Regular rate and rhythm. ?BREASTS: Symmetric in size. No palpable masses or lymphadenopathy, skin changes, or nipple drainage. ?ABDOMEN: Soft, nontender, nondistended. No organomegaly. ?PELVIC: Normal external female genitalia. Vagina is pink and rugated.  Normal discharge. Normal appearing cervix. Uterus is normal in size. No adnexal mass or tenderness. Chaperone present during the pelvic exam ?EXTREMITIES: No cyanosis, clubbing, or edema, 2+ distal pulses. ?  ?  ?Assessment:  ? ? Healthy female exam.    ?  ?Plan:  ? ? Pap smear collected per patient request ?Screening mammogram ordered ?Patient will be contacted with abnormal results ?See After Visit Summary for Counseling Recommendations  ? ?

## 2021-10-14 ENCOUNTER — Encounter: Payer: Self-pay | Admitting: General Practice

## 2021-10-15 LAB — CYTOLOGY - PAP
Comment: NEGATIVE
Diagnosis: NEGATIVE
High risk HPV: NEGATIVE

## 2021-10-19 ENCOUNTER — Encounter (HOSPITAL_BASED_OUTPATIENT_CLINIC_OR_DEPARTMENT_OTHER): Payer: Self-pay

## 2021-10-19 ENCOUNTER — Ambulatory Visit (HOSPITAL_BASED_OUTPATIENT_CLINIC_OR_DEPARTMENT_OTHER)
Admission: RE | Admit: 2021-10-19 | Discharge: 2021-10-19 | Disposition: A | Discharge status: Home / Self Care | Payer: 59 | Source: Ambulatory Visit | Attending: Obstetrics and Gynecology | Admitting: Obstetrics and Gynecology

## 2021-10-19 DIAGNOSIS — Z1231 Encounter for screening mammogram for malignant neoplasm of breast: Secondary | ICD-10-CM | POA: Diagnosis not present

## 2021-10-19 DIAGNOSIS — Z01419 Encounter for gynecological examination (general) (routine) without abnormal findings: Secondary | ICD-10-CM | POA: Diagnosis present

## 2021-11-17 ENCOUNTER — Ambulatory Visit: Payer: 59 | Admitting: Family Medicine

## 2022-08-03 ENCOUNTER — Ambulatory Visit: Payer: Commercial Managed Care - HMO | Admitting: Family

## 2022-08-03 VITALS — BP 112/60 | HR 74 | Temp 98.4°F | Ht 64.0 in | Wt 135.8 lb

## 2022-08-03 DIAGNOSIS — B349 Viral infection, unspecified: Secondary | ICD-10-CM

## 2022-08-03 DIAGNOSIS — R52 Pain, unspecified: Secondary | ICD-10-CM | POA: Diagnosis not present

## 2022-08-03 LAB — CBC WITH DIFFERENTIAL/PLATELET
Basophils Absolute: 0 10*3/uL (ref 0.0–0.1)
Basophils Relative: 0.3 % (ref 0.0–3.0)
Eosinophils Absolute: 0.2 10*3/uL (ref 0.0–0.7)
Eosinophils Relative: 2.8 % (ref 0.0–5.0)
HCT: 40.4 % (ref 36.0–46.0)
Hemoglobin: 13.5 g/dL (ref 12.0–15.0)
Lymphocytes Relative: 10.6 % — ABNORMAL LOW (ref 12.0–46.0)
Lymphs Abs: 0.6 10*3/uL — ABNORMAL LOW (ref 0.7–4.0)
MCHC: 33.5 g/dL (ref 30.0–36.0)
MCV: 81.1 fl (ref 78.0–100.0)
Monocytes Absolute: 0.3 10*3/uL (ref 0.1–1.0)
Monocytes Relative: 5.7 % (ref 3.0–12.0)
Neutro Abs: 4.5 10*3/uL (ref 1.4–7.7)
Neutrophils Relative %: 80.6 % — ABNORMAL HIGH (ref 43.0–77.0)
Platelets: 197 10*3/uL (ref 150.0–400.0)
RBC: 4.98 Mil/uL (ref 3.87–5.11)
RDW: 12.8 % (ref 11.5–15.5)
WBC: 5.6 10*3/uL (ref 4.0–10.5)

## 2022-08-03 LAB — COMPREHENSIVE METABOLIC PANEL
ALT: 17 U/L (ref 0–35)
AST: 23 U/L (ref 0–37)
Albumin: 4.4 g/dL (ref 3.5–5.2)
Alkaline Phosphatase: 57 U/L (ref 39–117)
BUN: 12 mg/dL (ref 6–23)
CO2: 27 mEq/L (ref 19–32)
Calcium: 9.1 mg/dL (ref 8.4–10.5)
Chloride: 103 mEq/L (ref 96–112)
Creatinine, Ser: 0.68 mg/dL (ref 0.40–1.20)
GFR: 106.71 mL/min (ref >60.00)
Glucose, Bld: 92 mg/dL (ref 70–99)
Potassium: 3.6 mEq/L (ref 3.5–5.1)
Sodium: 138 mEq/L (ref 135–145)
Total Bilirubin: 0.6 mg/dL (ref 0.2–1.2)
Total Protein: 7.5 g/dL (ref 6.0–8.3)

## 2022-08-03 LAB — POC COVID19 BINAXNOW: SARS Coronavirus 2 Ag: NEGATIVE

## 2022-08-03 LAB — POCT RESPIRATORY SYNCYTIAL VIRUS: RSV Rapid Ag: NEGATIVE

## 2022-08-03 NOTE — Progress Notes (Signed)
Acute Office Visit  Subjective:     Patient ID: Alicia Werner, female    DOB: 1981-03-10, 42 y.o.   MRN: [id number]  Chief Complaint  Patient presents with   Generalized Body Aches    Notes she has had body aches,feeling chilled. 2 weeks ago treated for sinus infection that cleared     HPI Patient is in today with c/o body aches, chills, and mild nasal congestion x 2 days. She has not been taking any medications. 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   Review of Systems  Constitutional:  Positive for chills.  HENT:  Positive for congestion.   Respiratory: Negative.  Negative for cough.   Cardiovascular: Negative.   Musculoskeletal:  Positive for myalgias.  All other systems reviewed and are negative.  Past Medical History:  Diagnosis Date   Allergy    Medical history non-contributory    Vaginal delivery 2008, 2010    Social History   Socioeconomic History   Marital status: Married    Spouse name: Not on file   Number of children: Not on file   Years of education: Not on file   Highest education level: Not on file  Occupational History   Not on file  Tobacco Use   Smoking status: Never   Smokeless tobacco: Never  Substance and Sexual Activity   Alcohol use: No    Alcohol/week: 0.0 standard drinks of alcohol   Drug use: No   Sexual activity: Yes  Other Topics Concern   Not on file  Social History Narrative   Not on file   Social Determinants of Health   Financial Resource Strain: Not on file  Food Insecurity: Not on file  Transportation Needs: Not on file  Physical Activity: Not on file  Stress: Not on file  Social Connections: Not on file  Intimate Partner Violence: Not on file    Past Surgical History:  Procedure Laterality Date   [place] N/A 03/28/2014   Procedure: CESAREAN SECTION WITH BILATERAL TUBAL LIGATION;  Surgeon: Jerelyn Charles, MD;  Location: [facility];  Service: Obstetrics;  Laterality:  N/A;   FACIAL COSMETIC SURGERY  2003   NO PAST SURGERIES      Family History  Problem Relation Age of Onset   Asthma Son    Cancer Maternal Uncle        Stomach CA    Allergies  Allergen Reactions   Shellfish Allergy Hives and Swelling    Patient reports facial swelling, redness, and hives after eating shellfish. Allergy testing done.    Aspirin     Stomach pain    Peanuts [Peanut Oil]    Pineapple     Patient reports scratchy, sore throat after eating.     Current Outpatient Medications on File Prior to Visit  Medication Sig Dispense Refill   cetirizine (ZYRTEC ALLERGY) 10 MG tablet Take 1 tablet (10 mg total) by mouth daily. 30 tablet 0   fluticasone (FLONASE) 50 MCG/ACT nasal spray Place 2 sprays into both nostrils daily. 16 g 1   amoxicillin-clavulanate (AUGMENTIN) 875-125 MG tablet Take 1 tablet by mouth 2 (two) times daily. (Patient not taking: Reported on 10/12/2021) 20 tablet 0   benzonatate (TESSALON) 100 MG capsule Take 1-2 capsules (100-200 mg total) by mouth 3 (three) times daily as needed for cough. (Patient not taking: Reported on 10/12/2021) 60 capsule 0   promethazine-dextromethorphan (PROMETHAZINE-DM) 6.25-15 MG/5ML syrup  Take 5 mLs by mouth at bedtime as needed for cough. (Patient not taking: Reported on 10/12/2021) 100 mL 0   [DISCONTINUED] montelukast (SINGULAIR) 10 MG tablet Take 10 mg by mouth at bedtime. Reported on 08/17/2015 (Patient not taking: No sig reported)     No current facility-administered medications on file prior to visit.    BP 112/60   Pulse 74   Temp 98.4 F (36.9 C) (Oral)   Ht 5\' 4"  (1.626 m)   Wt 135 lb 12.8 oz (61.6 kg)   SpO2 97%   BMI 23.31 kg/m chart      Objective:    BP 112/60   Pulse 74   Temp 98.4 F (36.9 C) (Oral)   Ht 5\' 4"  (1.626 m)   Wt 135 lb 12.8 oz (61.6 kg)   SpO2 97%   BMI 23.31 kg/m    Physical Exam Vitals and nursing note reviewed.  Constitutional:      Appearance: Normal appearance. She is normal  weight.  Cardiovascular:     Rate and Rhythm: Normal rate and regular rhythm.  Pulmonary:     Effort: Pulmonary effort is normal.     Breath sounds: Normal breath sounds.  Abdominal:     General: Abdomen is flat.     Palpations: Abdomen is soft.  Musculoskeletal:        General: Normal range of motion.     Cervical back: Normal range of motion and neck supple.  Skin:    General: Skin is warm and dry.  Neurological:     General: No focal deficit present.     Mental Status: She is alert and oriented to person, place, and time. Mental status is at baseline.  Psychiatric:        Mood and Affect: Mood normal.        Behavior: Behavior normal.     Results for orders placed or performed in visit on 08/03/22  POC COVID-19  Result Value Ref Range   SARS Coronavirus 2 Ag Negative Negative  POCT respiratory syncytial virus  Result Value Ref Range   RSV Rapid Ag Negative         Assessment & Plan:   Problem List Items Addressed This Visit   None Visit Diagnoses     Body aches    -  Primary   Relevant Orders   POC COVID-19 (Completed)   POCT respiratory syncytial virus (Completed)   CBC w/Diff   Comp Met (CMET)   Viral illness       Relevant Orders   CBC w/Diff   Comp Met (CMET)       No orders of the defined types were placed in this encounter.  Call the office if symptoms worsen or persist. Recheck pending labs and sooner as needed.   Kennyth Arnold, FNP

## 2022-08-04 ENCOUNTER — Telehealth: Payer: Self-pay

## 2022-08-04 NOTE — Telephone Encounter (Signed)
-----  Message from Kennyth Arnold, [place] sent at 08/04/2022  9:08 AM EST ----- Labs are all normal. Ibuprofen as needed for body aches. This is likely viral. If not better in 1 week, let us know

## 2022-08-05 NOTE — Telephone Encounter (Signed)
Called again no answer  

## 2022-08-06 NOTE — Telephone Encounter (Signed)
Letter sent, message closed

## 2022-08-06 NOTE — Telephone Encounter (Signed)
LM again will send letter

## 2023-06-01 ENCOUNTER — Ambulatory Visit (INDEPENDENT_AMBULATORY_CARE_PROVIDER_SITE_OTHER): Payer: Managed Care, Other (non HMO) | Admitting: Family Medicine

## 2023-06-01 ENCOUNTER — Encounter: Payer: Self-pay | Admitting: Family Medicine

## 2023-06-01 VITALS — BP 105/44 | HR 63 | Ht 63.0 in | Wt 138.0 lb

## 2023-06-01 DIAGNOSIS — M549 Dorsalgia, unspecified: Secondary | ICD-10-CM

## 2023-06-01 DIAGNOSIS — Z862 Personal history of diseases of the blood and blood-forming organs and certain disorders involving the immune mechanism: Secondary | ICD-10-CM | POA: Diagnosis not present

## 2023-06-01 DIAGNOSIS — Z9109 Other allergy status, other than to drugs and biological substances: Secondary | ICD-10-CM

## 2023-06-01 DIAGNOSIS — L659 Nonscarring hair loss, unspecified: Secondary | ICD-10-CM

## 2023-06-01 DIAGNOSIS — M62838 Other muscle spasm: Secondary | ICD-10-CM | POA: Diagnosis not present

## 2023-06-01 DIAGNOSIS — Z0000 Encounter for general adult medical examination without abnormal findings: Secondary | ICD-10-CM

## 2023-06-01 LAB — TSH: TSH: 1.32 u[IU]/mL (ref 0.35–5.50)

## 2023-06-01 LAB — CBC WITH DIFFERENTIAL/PLATELET
Basophils Absolute: 0 10*3/uL (ref 0.0–0.1)
Basophils Relative: 0.8 % (ref 0.0–3.0)
Eosinophils Absolute: 0.1 10*3/uL (ref 0.0–0.7)
Eosinophils Relative: 2.4 % (ref 0.0–5.0)
HCT: 39.5 % (ref 36.0–46.0)
Hemoglobin: 12.7 g/dL (ref 12.0–15.0)
Lymphocytes Relative: 35.5 % (ref 12.0–46.0)
Lymphs Abs: 1.5 10*3/uL (ref 0.7–4.0)
MCHC: 32.3 g/dL (ref 30.0–36.0)
MCV: 83.7 fL (ref 78.0–100.0)
Monocytes Absolute: 0.3 10*3/uL (ref 0.1–1.0)
Monocytes Relative: 7.1 % (ref 3.0–12.0)
Neutro Abs: 2.3 10*3/uL (ref 1.4–7.7)
Neutrophils Relative %: 54.2 % (ref 43.0–77.0)
Platelets: 255 10*3/uL (ref 150.0–400.0)
RBC: 4.72 Mil/uL (ref 3.87–5.11)
RDW: 13 % (ref 11.5–15.5)
WBC: 4.2 10*3/uL (ref 4.0–10.5)

## 2023-06-01 LAB — B12 AND FOLATE PANEL
Folate: 16.1 ng/mL (ref >5.9)
Vitamin B-12: 348 pg/mL (ref 211–911)

## 2023-06-01 LAB — COMPREHENSIVE METABOLIC PANEL
ALT: 15 U/L (ref 0–35)
AST: 19 U/L (ref 0–37)
Albumin: 4.5 g/dL (ref 3.5–5.2)
Alkaline Phosphatase: 48 U/L (ref 39–117)
BUN: 16 mg/dL (ref 6–23)
CO2: 28 meq/L (ref 19–32)
Calcium: 9.2 mg/dL (ref 8.4–10.5)
Chloride: 105 meq/L (ref 96–112)
Creatinine, Ser: 0.7 mg/dL (ref 0.40–1.20)
GFR: 105.35 mL/min (ref >60.00)
Glucose, Bld: 89 mg/dL (ref 70–99)
Potassium: 4 meq/L (ref 3.5–5.1)
Sodium: 139 meq/L (ref 135–145)
Total Bilirubin: 0.5 mg/dL (ref 0.2–1.2)
Total Protein: 6.9 g/dL (ref 6.0–8.3)

## 2023-06-01 LAB — IBC + FERRITIN
Ferritin: 17.1 ng/mL (ref 10.0–291.0)
Iron: 56 ug/dL (ref 42–145)
Saturation Ratios: 12.6 % — ABNORMAL LOW (ref 20.0–50.0)
TIBC: 443.8 ug/dL (ref 250.0–450.0)
Transferrin: 317 mg/dL (ref 212.0–360.0)

## 2023-06-01 MED ORDER — CYCLOBENZAPRINE HCL 5 MG PO TABS: 5.0000 mg | ORAL_TABLET | Freq: Three times a day (TID) | ORAL | 1 refills | Status: AC | PRN

## 2023-06-01 MED ORDER — MONTELUKAST SODIUM 10 MG PO TABS: 10.0000 mg | ORAL_TABLET | Freq: Every day | ORAL | 3 refills | Status: AC

## 2023-06-01 NOTE — Patient Instructions (Signed)
 Thank you for choosing [facility] for your Primary Care needs. I am excited for the opportunity to partner with you to meet your health care goals. It was a pleasure meeting you today!  Information on diet, exercise, and health maintenance recommendations are listed below. This is information to help you be sure you are on track for optimal health and monitoring.   Please look over this and let us know if you have any questions or if you have completed any of the health maintenance outside of [facility] so that we can be sure your records are up to date.  ___________________________________________________________  MyChart:  For all urgent or time sensitive needs we ask that you please call the office to avoid delays. Our number is [phone]. MyChart is not constantly monitored and due to the large volume of messages a day, replies may take up to 72 business hours.  MyChart Policy: MyChart allows for you to see your visit notes, after visit summary, provider recommendations, lab and tests results, make an appointment, request refills, and contact your provider or the office for non-urgent questions or concerns. Providers are seeing patients during normal business hours and do not have built in time to review MyChart messages.  We ask that you allow a minimum of 3 business days for responses to [employer]. For this reason, please do not send urgent requests through MyChart. Please call the office at [phone]. New and ongoing conditions may require a visit. We have virtual and in-person visits available for your convenience.  Complex MyChart concerns may require a visit. Your provider may request you schedule a virtual or in-person visit to ensure we are providing the best care possible. MyChart messages sent after 11:00 AM on Friday may not be received by the provider until Monday morning.    Lab and Test Results: You will receive your lab and test  results on MyChart as soon as they are completed and results have been sent by the lab or testing facility. Due to this service, you will receive your results BEFORE your provider.  I review lab and test results each morning prior to seeing patients. Some results require collaboration with other providers to ensure you are receiving the most appropriate care. For this reason, we ask that you please allow a minimum of 3-5 business days from the time that ALL results have been received for your provider to receive and review lab and test results and contact you about these.  Most lab and test result comments from the provider will be sent through MyChart. Your provider may recommend changes to the plan of care, follow-up visits, repeat testing, ask questions, or request an office visit to discuss these results. You may reply directly to this message or call the office to provide information for the provider or set up an appointment. In some instances, you will be called with test results and recommendations. Please let us know if this is preferred and we will make note of this in your chart to provide this for you.    If you have not heard a response to your lab or test results in 5 business days from all results returning to MyChart, please call the office to let us know. We ask that you please avoid calling prior to this time unless there is an emergent concern. Due to high call volumes, this can delay the resulting process.  After Hours: For all non-emergency after hours needs, please  call the office at [phone] and select the option to reach the on-call  service. On-call services are shared between multiple [place] offices and therefore it will not be possible to speak directly with your provider. On-call providers may provide medical advice and recommendations, but are unable to provide refills for maintenance medications.  For all emergency or urgent medical needs after normal business hours, we  recommend that you seek care at the closest Urgent Care or Emergency Department to ensure appropriate treatment in a timely manner.  [facility] has a 24 hour emergency room located on the ground floor for your convenience.   Urgent Concerns During the Business Day Providers are seeing patients from 8AM to 5PM with a busy schedule and are most often not able to respond to non-urgent calls until the end of the day or the next business day. If you should have URGENT concerns during the day, please call and speak to the nurse or schedule a same day appointment so that we can address your concern without delay.   Thank you, again, for choosing me as your health care partner. I appreciate your trust and look forward to learning more about you!   Lollie Marrow Reola Calkins, DNP, FNP-C  ___________________________________________________________  Health Maintenance Recommendations Screening Testing Mammogram Every 1-2 years based on history and risk factors Starting at age 89 Pap Smear Ages 21-39 every 3 years Ages 67-65 every 5 years with HPV testing More frequent testing may be required based on results and history Colon Cancer Screening Every 1-10 years based on test performed, risk factors, and history Starting at age 108 Bone Density Screening Every 2-10 years based on history Starting at age 80 for women Recommendations for men differ based on medication usage, history, and risk factors AAA Screening One time ultrasound Men 52-68 years old who have ever smoked Lung Cancer Screening Low Dose Lung CT every 12 months Age 29-80 years with a 20 pack-year smoking history who still smoke or who have quit within the last 15 years  Screening Labs Routine  Labs: Complete Blood Count (CBC), Complete Metabolic Panel (CMP), Cholesterol (Lipid Panel) Every 6-12 months based on history and medications May be recommended more frequently based on current conditions or previous results Hemoglobin  A1c Lab Every 3-12 months based on history and previous results Starting at age 1 or earlier with diagnosis of diabetes, high cholesterol, BMI >26, and/or risk factors Frequent monitoring for patients with diabetes to ensure blood sugar control Thyroid Panel  Every 6 months based on history, symptoms, and risk factors May be repeated more often if on medication HIV One time testing for all patients 92 and older May be repeated more frequently for patients with increased risk factors or exposure Hepatitis C One time testing for all patients 19 and older May be repeated more frequently for patients with increased risk factors or exposure Gonorrhea, Chlamydia Every 12 months for all sexually active persons 13-24 years Additional monitoring may be recommended for those who are considered high risk or who have symptoms PSA Men 34-21 years old with risk factors Additional screening may be recommended from age 37-69 based on risk factors, symptoms, and history  Vaccine Recommendations Tetanus Booster All adults every 10 years Flu Vaccine All patients 6 months and older every year COVID Vaccine All patients 12 years and older Initial dosing with booster May recommend additional booster based on age and health history HPV Vaccine 2 doses all patients age 3-26 Dosing may be considered  for patients over 26 Shingles Vaccine (Shingrix) 2 doses all adults 50 years and older Pneumonia (Pneumovax 23) All adults 65 years and older May recommend earlier dosing based on health history Pneumonia (Prevnar 60) All adults 65 years and older Dosed 1 year after Pneumovax 23 Pneumonia (Prevnar 20) All adults 65 years and older (adults 19-64 with certain conditions or risk factors) 1 dose  For those who have not received Prevnar 13 vaccine previously   Additional Screening, Testing, and Vaccinations may be recommended on an individualized basis based on family history, health history, risk  factors, and/or exposure.  __________________________________________________________  Diet Recommendations for All Patients  I recommend that all patients maintain a diet low in saturated fats, carbohydrates, and cholesterol. While this can be challenging at first, it is not impossible and small changes can make big differences.  Things to try: Decreasing the amount of soda, sweet tea, and/or juice to one or less per day and replace with water While water is always the first choice, if you do not like water you may consider adding a water additive without sugar to improve the taste other sugar free drinks Replace potatoes with a brightly colored vegetable  Use healthy oils, such as canola oil or olive oil, instead of butter or hard margarine Limit your bread intake to two pieces or less a day Replace regular pasta with low carb pasta options Bake, broil, or grill foods instead of frying Monitor portion sizes  Eat smaller, more frequent meals throughout the day instead of large meals  An important thing to remember is, if you love foods that are not great for your health, you don't have to give them up completely. Instead, allow these foods to be a reward when you have done well. Allowing yourself to still have special treats every once in a while is a nice way to tell yourself thank you for working hard to keep yourself healthy.   Also remember that every day is a new day. If you have a bad day and "fall off the wagon", you can still climb right back up and keep moving along on your journey!  We have resources available to help you!  Some websites that may be helpful include: www.http://www.wall-[place].info/  Www.VeryWellFit.com _____________________________________________________________  Activity Recommendations for All Patients  I recommend that all adults get at least 30 minutes of moderate physical activity that elevates your heart rate at least 5 days out of the week.  Some examples  include: Walking or jogging at a pace that allows you to carry on a conversation Cycling (stationary bike or outdoors) Water aerobics Yoga Weight lifting Dancing If physical limitations prevent you from putting stress on your joints, exercise in a pool or seated in a chair are excellent options.  Do determine your MAXIMUM heart rate for activity: 220 - YOUR AGE = MAX Heart Rate   Remember! Do not push yourself too hard.  Start slowly and build up your pace, speed, weight, time in exercise, etc.  Allow your body to rest between exercise and get good sleep. You will need more water than normal when you are exerting yourself. Do not wait until you are thirsty to drink. Drink with a purpose of getting in at least 8, 8 ounce glasses of water a day plus more depending on how much you exercise and sweat.    If you begin to develop dizziness, chest pain, abdominal pain, jaw pain, shortness of breath, headache, vision changes, lightheadedness, or other concerning symptoms,  stop the activity and allow your body to rest. If your symptoms are severe, seek emergency evaluation immediately. If your symptoms are concerning, but not severe, please let us know so that we can recommend further evaluation.

## 2023-06-01 NOTE — Progress Notes (Signed)
New Patient Office Visit  Subjective    Patient ID: NATOYA RASH, female    DOB: August 23, 1980  Age: 42 y.o. MRN: [id number]  CC:  Chief Complaint  Patient presents with   Establish Care    HPI CALEB KELSH presents to establish care. She is transferring from [employer].    Discussed the use of AI scribe software for clinical note transcription with the patient, who gave verbal consent to proceed.  History of Present Illness   The patient, a nail technician, presents as a new patient with a chief complaint of right upper back and shoulder pain that has been progressively worsening over the past two months. The pain is described as a tight, squeezing sensation, localized behind the scapula, and is particularly severe at night. The patient has attempted self-management with massage, which provides temporary relief, but the pain consistently returns.    The patient also reports occasional exhaustion, particularly before her menstrual cycle.  In addition to the musculoskeletal complaint, the patient reports increased hair shedding over the past year, which she finds concerning. The patient has a history of allergies, including environmental and food allergies, and takes Singulair for management. She also has a history of C-sections and tubal ligation. The patient's family history includes asthma in two children, an uncle with stomach cancer, and parents with high cholesterol and high blood pressure.   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 She has not reported any new medications and is currently taking vitamins and supplements. The patient denies any recent use of biotin.            06/01/2023    9:47 AM 08/03/2022   10:47 AM 06/30/2020    2:51 PM  PHQ9 SCORE ONLY  PHQ-9 Total Score 5 0 0      06/01/2023    9:48 AM  GAD 7 : Generalized Anxiety Score  Nervous, Anxious, on Edge 1  Control/stop worrying 0  Worry  too much - different things 0  Trouble relaxing 0  Restless 0  Easily annoyed or irritable 0  Afraid - awful might happen 0  Total GAD 7 Score 1  Anxiety Difficulty Somewhat difficult           Outpatient Encounter Medications as of 06/01/2023  Medication Sig   Biotin 1 MG CAPS Take by mouth.   cyclobenzaprine (FLEXERIL) 5 MG tablet Take 1 tablet (5 mg total) by mouth 3 (three) times daily as needed for muscle spasms.   Multiple Vitamin (MULTIVITAMIN) capsule Take 1 capsule by mouth daily.   TURMERIC PO Take by mouth.   montelukast (SINGULAIR) 10 MG tablet Take 1 tablet (10 mg total) by mouth at bedtime.   [DISCONTINUED] amoxicillin-clavulanate (AUGMENTIN) 875-125 MG tablet Take 1 tablet by mouth 2 (two) times daily. (Patient not taking: Reported on 10/12/2021)   [DISCONTINUED] benzonatate (TESSALON) 100 MG capsule Take 1-2 capsules (100-200 mg total) by mouth 3 (three) times daily as needed for cough. (Patient not taking: Reported on 10/12/2021)   [DISCONTINUED] cetirizine (ZYRTEC ALLERGY) 10 MG tablet Take 1 tablet (10 mg total) by mouth daily.   [DISCONTINUED] fluticasone (FLONASE) 50 MCG/ACT nasal spray Place 2 sprays into both nostrils daily.   [DISCONTINUED] montelukast (SINGULAIR) 10 MG tablet Take 10 mg by mouth at bedtime. Reported on 08/17/2015 (Patient not taking: No sig reported)   [DISCONTINUED] montelukast (SINGULAIR) 10 MG tablet Take 10 mg by mouth at bedtime. (Patient not  taking: Reported on 06/01/2023)   [DISCONTINUED] promethazine-dextromethorphan (PROMETHAZINE-DM) 6.25-15 MG/5ML syrup Take 5 mLs by mouth at bedtime as needed for cough. (Patient not taking: Reported on 10/12/2021)   No facility-administered encounter medications on file as of 06/01/2023.    Past Medical History:  Diagnosis Date   Allergy    Vaginal delivery 2008, 2010    Past Surgical History:  Procedure Laterality Date   CESAREAN SECTION     CESAREAN SECTION WITH BILATERAL TUBAL LIGATION N/A  03/28/2014   Procedure: CESAREAN SECTION WITH BILATERAL TUBAL LIGATION;  Surgeon: Marlow Baars, MD;  Location: [facility];  Service: Obstetrics;  Laterality: N/A;   FACIAL COSMETIC SURGERY  07/12/2001    Family History  Problem Relation Age of Onset   Hyperlipidemia Mother    Hypertension Father    Hyperlipidemia Father    Asthma Daughter    Asthma Son    Cancer Maternal Uncle        Stomach CA    Social History   Socioeconomic History   Marital status: Married    Spouse name: Not on file   Number of children: Not on file   Years of education: Not on file   Highest education level: Not on file  Occupational History   Not on file  Tobacco Use   Smoking status: Never   Smokeless tobacco: Never  Substance and Sexual Activity   Alcohol use: Yes    Comment: 1 drink once a week - rarely   Drug use: No   Sexual activity: Yes    Comment: tubal ligation at last c-section  Other Topics Concern   Not on file  Social History Narrative   Not on file   Social Determinants of Health   Financial Resource Strain: Not on file  Food Insecurity: Not on file  Transportation Needs: Not on file  Physical Activity: Not on file  Stress: Not on file  Social Connections: Not on file  Intimate Partner Violence: Not on file    ROS All review of systems negative except what is listed in the HPI      Objective    BP (!) 105/44   Pulse 63   Ht 5\' 3"  (1.6 m)   Wt 138 lb (62.6 kg)   SpO2 98%   BMI 24.45 kg/m   Physical Exam Vitals reviewed.  Constitutional:      Appearance: Normal appearance.  Cardiovascular:     Rate and Rhythm: Normal rate and regular rhythm.  Pulmonary:     Effort: Pulmonary effort is normal.     Breath sounds: Normal breath sounds.  Musculoskeletal:       Back:  Skin:    General: Skin is warm and dry.  Neurological:     Mental Status: She is alert and oriented to person, place, and time.  Psychiatric:        Mood and Affect: Mood normal.         Behavior: Behavior normal.        Thought Content: Thought content normal.        Judgment: Judgment normal.         Assessment & Plan:   Problem List Items Addressed This Visit   None Visit Diagnoses     Upper back pain    -  Primary   Relevant Medications   cyclobenzaprine (FLEXERIL) 5 MG tablet   Other Relevant Orders   Ambulatory referral to Physical Therapy   Encounter for medical examination to establish  care       Muscle spasm       Relevant Medications   cyclobenzaprine (FLEXERIL) 5 MG tablet   Other Relevant Orders   Ambulatory referral to Physical Therapy   History of anemia       Relevant Orders   CBC with Differential/Platelet   B12 and Folate Panel   IBC + Ferritin   Hair thinning       Relevant Orders   CBC with Differential/Platelet   Comprehensive metabolic panel   TSH   B12 and Folate Panel   IBC + Ferritin   Allergy, initial encounter       Environmental allergies       Relevant Medications   montelukast (SINGULAIR) 10 MG tablet          Musculoskeletal Pain Right upper back and shoulder pain, worsening over the past 1-2 months, exacerbated during menstruation and at night. No associated chest pain or shortness of breath. Pain is localized to the right upper back and shoulder, with a palpable knot in the muscle. -Refer to physical therapy for needling, massage, and heat therapy. -Prescribe Flexeril (muscle relaxer) to be taken as needed, cautioning about potential drowsiness.  Hair Loss Noted increased hair shedding over the past year, more than after last childbirth. No other symptoms of thyroid dysfunction reported. -Order thyroid function tests and basic labs to rule out any underlying conditions.   Allergic Rhinitis Currently managed with Singulair. -Refill Singulair prescription, to be picked up at [facility] on [address].          Return if symptoms worsen or fail to improve.   Clayborne Dana, NP

## 2023-06-07 ENCOUNTER — Other Ambulatory Visit (HOSPITAL_BASED_OUTPATIENT_CLINIC_OR_DEPARTMENT_OTHER): Payer: Self-pay

## 2023-06-07 ENCOUNTER — Other Ambulatory Visit: Payer: Self-pay | Admitting: Family Medicine

## 2023-06-07 MED ORDER — ONE-DAILY MULTI-VITAMIN PO TABS
1.0000 | ORAL_TABLET | Freq: Every day | ORAL | 1 refills | Status: AC
  Filled 2023-06-07: qty 100, 90d supply, fill #0
  Filled 2023-09-03: qty 100, 90d supply, fill #1

## 2023-06-07 MED ORDER — BIOTIN 1 MG PO CAPS
1.0000 mg | ORAL_CAPSULE | Freq: Every day | ORAL | 1 refills | Status: AC
  Filled 2023-06-07: qty 150, 90d supply, fill #0
  Filled 2023-09-03: qty 150, fill #0

## 2023-06-10 ENCOUNTER — Other Ambulatory Visit (HOSPITAL_BASED_OUTPATIENT_CLINIC_OR_DEPARTMENT_OTHER): Payer: Self-pay

## 2023-06-17 ENCOUNTER — Other Ambulatory Visit: Payer: Self-pay

## 2023-06-17 ENCOUNTER — Ambulatory Visit: Payer: Commercial Managed Care - HMO | Attending: Family Medicine

## 2023-06-17 DIAGNOSIS — R293 Abnormal posture: Secondary | ICD-10-CM | POA: Diagnosis present

## 2023-06-17 DIAGNOSIS — M549 Dorsalgia, unspecified: Secondary | ICD-10-CM | POA: Insufficient documentation

## 2023-06-17 DIAGNOSIS — M62838 Other muscle spasm: Secondary | ICD-10-CM | POA: Insufficient documentation

## 2023-06-17 DIAGNOSIS — M6281 Muscle weakness (generalized): Secondary | ICD-10-CM | POA: Diagnosis present

## 2023-06-17 DIAGNOSIS — M542 Cervicalgia: Secondary | ICD-10-CM | POA: Diagnosis present

## 2023-06-17 NOTE — Therapy (Signed)
OUTPATIENT PHYSICAL THERAPY CERVICAL EVALUATION   Patient Name: Alicia Werner MRN: [id number] DOB:17-Jun-1981, 42 y.o., female Today's Date: 06/17/2023  END OF SESSION:  PT End of Session - 06/17/23 0858     Visit Number 1    Number of Visits 8    Date for PT Re-Evaluation 08/26/23    Authorization Type [employer]    PT Start Time 0801    PT Stop Time 0851    PT Time Calculation (min) 50 min    Activity Tolerance Patient tolerated treatment well;No increased pain    Behavior During Therapy [facility] for tasks assessed/performed             Past Medical History:  Diagnosis Date   Allergy    Vaginal delivery 2008, 2010   Past Surgical History:  Procedure Laterality Date   CESAREAN SECTION     CESAREAN SECTION WITH BILATERAL TUBAL LIGATION N/A 03/28/2014   Procedure: CESAREAN SECTION WITH BILATERAL TUBAL LIGATION;  Surgeon: Marlow Baars, MD;  Location: [facility];  Service: Obstetrics;  Laterality: N/A;   FACIAL COSMETIC SURGERY  07/12/2001   Patient Active Problem List   Diagnosis Date Noted   Recurrent sinusitis 11/23/2016   Cesarean delivery delivered 03/28/2014   Allergic rhinitis due to other allergen 09/09/2012   Other chronic allergic conjunctivitis 09/09/2012   Angioedema 09/09/2012   Other atopic dermatitis and related conditions 09/09/2012    PCP: Clayborne Dana, NP  REFERRING PROVIDER: Clayborne Dana, NP  REFERRING DIAG:  M54.9 (ICD-10-CM) - Upper back pain  M62.838 (ICD-10-CM) - Muscle spasm    THERAPY DIAG:  Cervicalgia  Abnormal posture  Muscle weakness (generalized)  Rationale for Evaluation and Treatment: Rehabilitation  ONSET DATE: 2 months ago 04/12/23  SUBJECTIVE:                                                                                                                                                                                                         SUBJECTIVE STATEMENT: Pt reports R medial and inferior scapular pain. When she is about to  have her menstrual cycle, it is worse and ties into migraines that are on the right side of her head. She doesn't notice anything in particular from work, but will hurt later.   Hand dominance: Right  PERTINENT HISTORY:  Cesarean with bilateral tubal ligation 03/28/2014  PAIN:  Are you having pain? Yes: NPRS scale: Current: 3-4/10, Best: 0/10, Worst: 8/9-10/10 Pain location: Medial and inferior R scapula Pain description: When really bad, like a drilling pain; achy otherwise Aggravating factors:  pre-menstrual cycle Relieving factors: Yoga (not currently doing it), warm massage oil which husband applies   PRECAUTIONS: None  RED FLAGS: None     WEIGHT BEARING RESTRICTIONS: No  FALLS:  Has patient fallen in last 6 months? No  LIVING ENVIRONMENT: Lives with: lives with their family Lives in: House/apartment Stairs: Yes: Internal: 13 steps; on right going up and External: 1 steps; none Has following equipment at home: None  OCCUPATION: Nail Technician at [employer]  PLOF: Independent  PATIENT GOALS: pain relief  NEXT MD VISIT: as needed   OBJECTIVE:  Note: Objective measures were completed at Evaluation unless otherwise noted.  DIAGNOSTIC FINDINGS:  N/A  PATIENT SURVEYS:  N/A  COGNITION: Overall cognitive status: Within functional limits for tasks assessed  SENSATION: WFL  POSTURE: rounded shoulders and forward head  PALPATION: TTP R cervical spine, UT, LS, medial and inferior R scapula   CERVICAL ROM:   Active ROM A/PROM (deg) eval  Flexion 50  Extension 60  Right lateral flexion 40  Left lateral flexion 40  Right rotation 60*  Left rotation 75   (Blank rows = not tested)  UPPER EXTREMITY ROM:  Active ROM Right eval Left eval  Shoulder flexion [facility]  Shoulder extension    Shoulder abduction [facility]  Shoulder adduction    Shoulder extension    Shoulder internal rotation T8* WFL  Shoulder external rotation [facility]  Elbow flexion    Elbow  extension    Wrist flexion    Wrist extension    Wrist ulnar deviation    Wrist radial deviation    Wrist pronation    Wrist supination     (Blank rows = not tested)  UPPER EXTREMITY MMT:  MMT Right eval Left eval  Shoulder flexion 4/5 5/5  Shoulder extension    Shoulder abduction 4/5 5/5  Shoulder adduction    Shoulder extension    Shoulder internal rotation 4/5 5/5  Shoulder external rotation 4-/5 4+/5  Middle trapezius    Lower trapezius    Elbow flexion    Elbow extension    Wrist flexion    Wrist extension    Wrist ulnar deviation    Wrist radial deviation    Wrist pronation    Wrist supination    Grip strength     (Blank rows = not tested)  CERVICAL SPECIAL TESTS:  Spurling's test: Negative and Distraction test: Negative  FUNCTIONAL TESTS:  NT  TODAY'S TREATMENT:                                                                                                                              DATE: 06/17/23   PATIENT EDUCATION:  Education details: Diagnosis, Prognosis, HEP, POC Person educated: Patient Education method: Explanation, Demonstration, Tactile cues, Verbal cues, and Handouts Education comprehension: verbalized understanding, returned demonstration, verbal cues required, tactile cues required, and needs further education  HOME EXERCISE PROGRAM: Access Code: [email or web address] URL: [email or web address]  Date: 06/17/2023 Prepared by: Gardiner Rhyme  Exercises - Open Books  - 1-2 x daily - 7 x weekly - 1-2 sets - 10 reps - Seated Thoracic Extension with Pectoralis Stretch  - 2 x daily - 7 x weekly - 1 sets - 10 reps - Doorway Pec Stretch at 90 Degrees Abduction  - 2 x daily - 7 x weekly - 2-3 sets - 20 seconds hold - Doorway Pec Stretch at 60 Degrees Abduction with Arm Straight  - 2 x daily - 7 x weekly - 2-3 sets - 20 seconds hold - Seated Scapular Retraction  - 2 x daily - 7 x weekly - 2-3 sets - 10 reps - 5 seconds hold  ASSESSMENT:  CLINICAL  IMPRESSION: Patient is a 42 y.o. female who was seen today for physical therapy evaluation and treatment for R shoulder and upper back pain of insidious onset that has worsened the last 2 months. Pt is a nail technician performing manicures mainly. She does not know specific biomechanical causes, as her pain is not present until she is resting. She notices her pain pre-menstrual cycle, and it is linked to R-sided migraines. Pt was TTP in TTP R cervical spine, UT, LS, medial and inferior R scapula. Cervical cluster was negative for cervical radiculopathy. Right cervical rotation is mildly painful is decreased, compared to L rotation. R shoulder is weaker with MMT as well in all planes with some upper arm discomfort during flexion and abduction testing. Pt has forward rounding of B shoulders R>L and mild forward head. She is dominant R-handed. Tresa Endo, interpreter present for session. Pt verbalized understanding and consent to treatment and was agreeable to POC. She will benefit from skilled PT 1-2x/week for 6-8 weeks to address impairments in pain, ROM, strength, and postural deficits.  OBJECTIVE IMPAIRMENTS: decreased ROM, decreased strength, increased fascial restrictions, increased muscle spasms, impaired flexibility, improper body mechanics, postural dysfunction, and pain.   ACTIVITY LIMITATIONS: lifting  PARTICIPATION LIMITATIONS: occupation  PERSONAL FACTORS: Profession and Time since onset of injury/illness/exacerbation are also affecting patient's functional outcome.   REHAB POTENTIAL: Excellent  CLINICAL DECISION MAKING: Stable/uncomplicated  EVALUATION COMPLEXITY: Low   GOALS: Goals reviewed with patient? No  SHORT TERM GOALS: Target date: 07/08/23  Pt will I and compliant with initial HEP. Baseline: provided at eval Goal status: INITIAL  2.  Pt will report a decrease in pain level from 8-9/10 at worst to 6-7/10.  Baseline: 8-9/10 at worst with pre-menstrual headaches Goal status:  INITIAL  LONG TERM GOALS: Target date: 08/25/22  Pt will independent with long term HEP to address posture, any recurrence, and maintain mobility/strength. Baseline: will provided Goal status: INITIAL  2.  Pt will decrease overall pain level to </= 4/10 at worst.  Baseline: 8-9/10 worst Goal status: INITIAL  3.  Pt will increase R shoulder strength via MMT to at least 4+/5. Baseline: see flow sheet Goal status: INITIAL  4.  Pt will report decrease in overall headache frequency that is related to neck/shoulder pain. Baseline: at least a few times/month during pre-menstrual cycle Goal status: INITIAL  5.  Pt will be able to work a full day without c/o pain afterward. Baseline: pain at home after work Goal status: INITIAL    PLAN:  PT FREQUENCY: 1-2x/week  PT DURATION: 8 weeks  PLANNED INTERVENTIONS: 97110-Therapeutic exercises, 97530- Therapeutic activity, [id number]- Neuromuscular re-education, 97535- Self Care, [id number]- Manual therapy, 97016- Vasopneumatic device, [id number]- Traction (mechanical), [id number]- Ionotophoresis 4mg /ml Dexamethasone, Patient/Family education, Taping, Dry Needling, Spinal  manipulation, Spinal mobilization, Cryotherapy, and Moist heat  PLAN FOR NEXT SESSION: DN (discussed with pt during eval and she would like to try at least once),     Marcelline Mates, PT, DPT 06/17/2023, 9:00 AM

## 2023-06-20 ENCOUNTER — Ambulatory Visit: Payer: Commercial Managed Care - HMO

## 2023-06-20 NOTE — Therapy (Unsigned)
OUTPATIENT PHYSICAL THERAPY CERVICAL EVALUATION   Patient Name: Alicia Werner MRN: [id number] DOB:Mar 16, 1981, 42 y.o., female Today's Date: 06/20/2023  END OF SESSION:    Past Medical History:  Diagnosis Date   Allergy    Vaginal delivery 2008, 2010   Past Surgical History:  Procedure Laterality Date   CESAREAN SECTION     CESAREAN SECTION WITH BILATERAL TUBAL LIGATION N/A 03/28/2014   Procedure: CESAREAN SECTION WITH BILATERAL TUBAL LIGATION;  Surgeon: Marlow Baars, MD;  Location: [facility];  Service: Obstetrics;  Laterality: N/A;   FACIAL COSMETIC SURGERY  07/12/2001   Patient Active Problem List   Diagnosis Date Noted   Recurrent sinusitis 11/23/2016   Cesarean delivery delivered 03/28/2014   Allergic rhinitis due to other allergen 09/09/2012   Other chronic allergic conjunctivitis 09/09/2012   Angioedema 09/09/2012   Other atopic dermatitis and related conditions 09/09/2012    PCP: Clayborne Dana, NP  REFERRING PROVIDER: Clayborne Dana, NP  REFERRING DIAG:  M54.9 (ICD-10-CM) - Upper back pain  M62.838 (ICD-10-CM) - Muscle spasm    THERAPY DIAG:  No diagnosis found.  Rationale for Evaluation and Treatment: Rehabilitation  ONSET DATE: 2 months ago 04/12/23  SUBJECTIVE:                                                                                                                                                                                                         SUBJECTIVE STATEMENT: Pt reports R medial and inferior scapular pain. When she is about to have her menstrual cycle, it is worse and ties into migraines that are on the right side of her head. She doesn't notice anything in particular from work, but will hurt later.   Hand dominance: Right  PERTINENT HISTORY:  Cesarean with bilateral tubal ligation 03/28/2014  PAIN:  Are you having pain? Yes: NPRS scale: Current: 3-4/10, Best: 0/10, Worst: 8/9-10/10 Pain location: Medial and inferior R scapula Pain  description: When really bad, like a drilling pain; achy otherwise Aggravating factors: pre-menstrual cycle Relieving factors: Yoga (not currently doing it), warm massage oil which husband applies   PRECAUTIONS: None  RED FLAGS: None     WEIGHT BEARING RESTRICTIONS: No  FALLS:  Has patient fallen in last 6 months? No  LIVING ENVIRONMENT: Lives with: lives with their family Lives in: House/apartment Stairs: Yes: Internal: 13 steps; on right going up and External: 1 steps; none Has following equipment at home: None  OCCUPATION: Nail Technician at [employer]  PLOF: Independent  PATIENT GOALS: pain relief  NEXT MD VISIT: as  needed   OBJECTIVE:  Note: Objective measures were completed at Evaluation unless otherwise noted.  DIAGNOSTIC FINDINGS:  N/A  PATIENT SURVEYS:  N/A  COGNITION: Overall cognitive status: Within functional limits for tasks assessed  SENSATION: WFL  POSTURE: rounded shoulders and forward head  PALPATION: TTP R cervical spine, UT, LS, medial and inferior R scapula   CERVICAL ROM:   Active ROM A/PROM (deg) eval  Flexion 50  Extension 60  Right lateral flexion 40  Left lateral flexion 40  Right rotation 60*  Left rotation 75   (Blank rows = not tested)  UPPER EXTREMITY ROM:  Active ROM Right eval Left eval  Shoulder flexion [facility]  Shoulder extension    Shoulder abduction [facility]  Shoulder adduction    Shoulder extension    Shoulder internal rotation T8* WFL  Shoulder external rotation [facility]  Elbow flexion    Elbow extension    Wrist flexion    Wrist extension    Wrist ulnar deviation    Wrist radial deviation    Wrist pronation    Wrist supination     (Blank rows = not tested)  UPPER EXTREMITY MMT:  MMT Right eval Left eval  Shoulder flexion 4/5 5/5  Shoulder extension    Shoulder abduction 4/5 5/5  Shoulder adduction    Shoulder extension    Shoulder internal rotation 4/5 5/5  Shoulder external rotation 4-/5  4+/5  Middle trapezius    Lower trapezius    Elbow flexion    Elbow extension    Wrist flexion    Wrist extension    Wrist ulnar deviation    Wrist radial deviation    Wrist pronation    Wrist supination    Grip strength     (Blank rows = not tested)  CERVICAL SPECIAL TESTS:  Spurling's test: Negative and Distraction test: Negative  FUNCTIONAL TESTS:  NT  TODAY'S TREATMENT:                                                                                                                              DATE: 06/17/23   PATIENT EDUCATION:  Education details: Diagnosis, Prognosis, HEP, POC Person educated: Patient Education method: Explanation, Demonstration, Tactile cues, Verbal cues, and Handouts Education comprehension: verbalized understanding, returned demonstration, verbal cues required, tactile cues required, and needs further education  HOME EXERCISE PROGRAM: Access Code: [email or web address] URL: [email or web address] Date: 06/17/2023 Prepared by: Gardiner Rhyme  Exercises - Open Books  - 1-2 x daily - 7 x weekly - 1-2 sets - 10 reps - Seated Thoracic Extension with Pectoralis Stretch  - 2 x daily - 7 x weekly - 1 sets - 10 reps - Doorway Pec Stretch at 90 Degrees Abduction  - 2 x daily - 7 x weekly - 2-3 sets - 20 seconds hold - Doorway Pec Stretch at 60 Degrees Abduction with Arm Straight  - 2 x daily - 7 x weekly - 2-3  sets - 20 seconds hold - Seated Scapular Retraction  - 2 x daily - 7 x weekly - 2-3 sets - 10 reps - 5 seconds hold  ASSESSMENT:  CLINICAL IMPRESSION: Patient is a 42 y.o. female who was seen today for physical therapy evaluation and treatment for R shoulder and upper back pain of insidious onset that has worsened the last 2 months. Pt is a nail technician performing manicures mainly. She does not know specific biomechanical causes, as her pain is not present until she is resting. She notices her pain pre-menstrual cycle, and it is linked to R-sided  migraines. Pt was TTP in TTP R cervical spine, UT, LS, medial and inferior R scapula. Cervical cluster was negative for cervical radiculopathy. Right cervical rotation is mildly painful is decreased, compared to L rotation. R shoulder is weaker with MMT as well in all planes with some upper arm discomfort during flexion and abduction testing. Pt has forward rounding of B shoulders R>L and mild forward head. She is dominant R-handed. Tresa Endo, interpreter present for session. Pt verbalized understanding and consent to treatment and was agreeable to POC. She will benefit from skilled PT 1-2x/week for 6-8 weeks to address impairments in pain, ROM, strength, and postural deficits.  OBJECTIVE IMPAIRMENTS: decreased ROM, decreased strength, increased fascial restrictions, increased muscle spasms, impaired flexibility, improper body mechanics, postural dysfunction, and pain.   ACTIVITY LIMITATIONS: lifting  PARTICIPATION LIMITATIONS: occupation  PERSONAL FACTORS: Profession and Time since onset of injury/illness/exacerbation are also affecting patient's functional outcome.   REHAB POTENTIAL: Excellent  CLINICAL DECISION MAKING: Stable/uncomplicated  EVALUATION COMPLEXITY: Low   GOALS: Goals reviewed with patient? No  SHORT TERM GOALS: Target date: 07/08/23  Pt will I and compliant with initial HEP. Baseline: provided at eval Goal status: INITIAL  2.  Pt will report a decrease in pain level from 8-9/10 at worst to 6-7/10.  Baseline: 8-9/10 at worst with pre-menstrual headaches Goal status: INITIAL  LONG TERM GOALS: Target date: 08/25/22  Pt will independent with long term HEP to address posture, any recurrence, and maintain mobility/strength. Baseline: will provided Goal status: INITIAL  2.  Pt will decrease overall pain level to </= 4/10 at worst.  Baseline: 8-9/10 worst Goal status: INITIAL  3.  Pt will increase R shoulder strength via MMT to at least 4+/5. Baseline: see flow  sheet Goal status: INITIAL  4.  Pt will report decrease in overall headache frequency that is related to neck/shoulder pain. Baseline: at least a few times/month during pre-menstrual cycle Goal status: INITIAL  5.  Pt will be able to work a full day without c/o pain afterward. Baseline: pain at home after work Goal status: INITIAL    PLAN:  PT FREQUENCY: 1-2x/week  PT DURATION: 8 weeks  PLANNED INTERVENTIONS: 97110-Therapeutic exercises, 97530- Therapeutic activity, [id number]- Neuromuscular re-education, 97535- Self Care, [id number]- Manual therapy, 97016- Vasopneumatic device, [id number]- Traction (mechanical), [id number]- Ionotophoresis 4mg /ml Dexamethasone, Patient/Family education, Taping, Dry Needling, Spinal manipulation, Spinal mobilization, Cryotherapy, and Moist heat  PLAN FOR NEXT SESSION: DN (discussed with pt during eval and she would like to try at least once),     Brooklynn Brandenburg L [place] Stanger, PT, DPT 06/20/2023, 8:01 AM

## 2023-07-07 ENCOUNTER — Ambulatory Visit: Payer: Commercial Managed Care - HMO | Admitting: Physical Therapy

## 2023-07-07 DIAGNOSIS — M542 Cervicalgia: Secondary | ICD-10-CM | POA: Diagnosis not present

## 2023-07-07 DIAGNOSIS — R293 Abnormal posture: Secondary | ICD-10-CM

## 2023-07-07 DIAGNOSIS — M6281 Muscle weakness (generalized): Secondary | ICD-10-CM

## 2023-07-07 NOTE — Patient Instructions (Signed)

## 2023-07-07 NOTE — Therapy (Signed)
OUTPATIENT PHYSICAL THERAPY CERVICAL TREATMENT   Patient Name: Alicia Werner MRN: [id number] DOB:1981-02-13, 42 y.o., female Today's Date: 07/07/2023  END OF SESSION:  PT End of Session - 07/07/23 0759     Visit Number 2    Number of Visits 8    Date for PT Re-Evaluation 08/26/23    Authorization Type [employer]    PT Start Time 0800    PT Stop Time 0840    PT Time Calculation (min) 40 min    Activity Tolerance Patient tolerated treatment well    Behavior During Therapy [facility] for tasks assessed/performed              Past Medical History:  Diagnosis Date   Allergy    Vaginal delivery 2008, 2010   Past Surgical History:  Procedure Laterality Date   CESAREAN SECTION     CESAREAN SECTION WITH BILATERAL TUBAL LIGATION N/A 03/28/2014   Procedure: CESAREAN SECTION WITH BILATERAL TUBAL LIGATION;  Surgeon: Marlow Baars, MD;  Location: [facility];  Service: Obstetrics;  Laterality: N/A;   FACIAL COSMETIC SURGERY  07/12/2001   Patient Active Problem List   Diagnosis Date Noted   Recurrent sinusitis 11/23/2016   Cesarean delivery delivered 03/28/2014   Allergic rhinitis due to other allergen 09/09/2012   Other chronic allergic conjunctivitis 09/09/2012   Angioedema 09/09/2012   Other atopic dermatitis and related conditions 09/09/2012    PCP: Clayborne Dana, NP  REFERRING PROVIDER: Clayborne Dana, NP  REFERRING DIAG:  M54.9 (ICD-10-CM) - Upper back pain  M62.838 (ICD-10-CM) - Muscle spasm    THERAPY DIAG:  Cervicalgia  Abnormal posture  Muscle weakness (generalized)  Rationale for Evaluation and Treatment: Rehabilitation  ONSET DATE: 2 months ago 04/12/23  SUBJECTIVE:                                                                                                                                                                                                         SUBJECTIVE STATEMENT: Pt states after eval she has been feeling better. Yesterday she thinks she might  have cooked too much had a little bit of pain. States she did do the exercises given to her from PT eval.   Hand dominance: Right  PERTINENT HISTORY:  Cesarean with bilateral tubal ligation 03/28/2014  PAIN:  Are you having pain? Yes: NPRS scale: Current: 4-5/10, Best: 0/10, Worst: 8/9-10/10 Pain location: Medial and inferior R scapula Pain description: When really bad, like a drilling pain; achy otherwise Aggravating factors: pre-menstrual cycle Relieving factors: Yoga (not currently doing it), warm  massage oil which husband applies   PRECAUTIONS: None  RED FLAGS: None     WEIGHT BEARING RESTRICTIONS: No  FALLS:  Has patient fallen in last 6 months? No  LIVING ENVIRONMENT: Lives with: lives with their family Lives in: House/apartment Stairs: Yes: Internal: 13 steps; on right going up and External: 1 steps; none Has following equipment at home: None  OCCUPATION: Nail Technician at [employer]  PLOF: Independent  PATIENT GOALS: pain relief  NEXT MD VISIT: as needed   OBJECTIVE:  Note: Objective measures were completed at Evaluation unless otherwise noted.  POSTURE: rounded shoulders and forward head  PALPATION: TTP R cervical spine, UT, LS, medial and inferior R scapula   CERVICAL ROM:   Active ROM A/PROM (deg) eval  Flexion 50  Extension 60  Right lateral flexion 40  Left lateral flexion 40  Right rotation 60*  Left rotation 75   (Blank rows = not tested)  UPPER EXTREMITY ROM:  Active ROM Right eval Left eval  Shoulder flexion [facility]  Shoulder extension    Shoulder abduction [facility]  Shoulder adduction    Shoulder extension    Shoulder internal rotation T8* WFL  Shoulder external rotation [facility]  Elbow flexion    Elbow extension    Wrist flexion    Wrist extension    Wrist ulnar deviation    Wrist radial deviation    Wrist pronation    Wrist supination     (Blank rows = not tested)  UPPER EXTREMITY MMT:  MMT Right eval Left eval   Shoulder flexion 4/5 5/5  Shoulder extension    Shoulder abduction 4/5 5/5  Shoulder adduction    Shoulder extension    Shoulder internal rotation 4/5 5/5  Shoulder external rotation 4-/5 4+/5  Middle trapezius    Lower trapezius    Elbow flexion    Elbow extension    Wrist flexion    Wrist extension    Wrist ulnar deviation    Wrist radial deviation    Wrist pronation    Wrist supination    Grip strength     (Blank rows = not tested)  CERVICAL SPECIAL TESTS:  Spurling's test: Negative and Distraction test: Negative  FUNCTIONAL TESTS:  NT  TODAY'S TREATMENT:                                                                                                                              DATE:  07/07/23  Child's pose x30" Quadruped thread the needle x30" Quadruped thoracic rotation x30" Sidelying open/close book x10 Sitting scap squeeze x10 Sitting shoulder external rotation red TB 2x10 Sitting "W" 2x10 Standing row red TB 2x10 Standing shoulder ext red TB 2x10  Manual therapy: STM & TPR levator scap, periscapular muscles Skilled assessment and palpation for TPDN Trigger Point Dry-Needling  Treatment instructions: Expect mild to moderate muscle soreness. S/S of pneumothorax if dry needled over a lung field, and to  seek immediate medical attention should they occur. Patient verbalized understanding of these instructions and education.  Patient Consent Given: Yes Education handout provided: Yes Muscles treated: rhomboid, mid trap, levator scap, thoracic paraspinal Electrical stimulation performed: No Parameters: N/A Treatment response/outcome: Decreased muscle tension  Self care: Self massage with tennis ball    PATIENT EDUCATION:  Education details: Diagnosis, Prognosis, HEP, POC Person educated: Patient Education method: Explanation, Demonstration, Tactile cues, Verbal cues, and Handouts Education comprehension: verbalized understanding, returned demonstration,  verbal cues required, tactile cues required, and needs further education  HOME EXERCISE PROGRAM: Access Code: [email or web address] URL: [email or web address] Date: 06/17/2023 Prepared by: Gardiner Rhyme  Exercises - Open Books  - 1-2 x daily - 7 x weekly - 1-2 sets - 10 reps - Seated Thoracic Extension with Pectoralis Stretch  - 2 x daily - 7 x weekly - 1 sets - 10 reps - Doorway Pec Stretch at 90 Degrees Abduction  - 2 x daily - 7 x weekly - 2-3 sets - 20 seconds hold - Doorway Pec Stretch at 60 Degrees Abduction with Arm Straight  - 2 x daily - 7 x weekly - 2-3 sets - 20 seconds hold - Seated Scapular Retraction  - 2 x daily - 7 x weekly - 2-3 sets - 10 reps - 5 seconds hold  ASSESSMENT:  CLINICAL IMPRESSION: Patient is a 42 y.o. female who was seen today for physical therapy treatment for R shoulder and upper back pain of insidious onset that has worsened the last 2 months. Pt is a nail technician performing manicures mainly. Treatment focused on    She does not know specific biomechanical causes, as her pain is not present until she is resting. She notices her pain pre-menstrual cycle, and it is linked to R-sided migraines. Pt was TTP in TTP R cervical spine, UT, LS, medial and inferior R scapula. Cervical cluster was negative for cervical radiculopathy. Right cervical rotation is mildly painful is decreased, compared to L rotation. R shoulder is weaker with MMT as well in all planes with some upper arm discomfort during flexion and abduction testing. Pt has forward rounding of B shoulders R>L and mild forward head. She is dominant R-handed. Tresa Endo, interpreter present for session. Pt verbalized understanding and consent to treatment and was agreeable to POC. She will benefit from skilled PT 1-2x/week for 6-8 weeks to address impairments in pain, ROM, strength, and postural deficits.  OBJECTIVE IMPAIRMENTS: decreased ROM, decreased strength, increased fascial restrictions, increased muscle  spasms, impaired flexibility, improper body mechanics, postural dysfunction, and pain.     GOALS: Goals reviewed with patient? No  SHORT TERM GOALS: Target date: 07/08/23  Pt will I and compliant with initial HEP. Baseline: provided at eval Goal status: INITIAL  2.  Pt will report a decrease in pain level from 8-9/10 at worst to 6-7/10.  Baseline: 8-9/10 at worst with pre-menstrual headaches Goal status: INITIAL  LONG TERM GOALS: Target date: 08/25/22  Pt will independent with long term HEP to address posture, any recurrence, and maintain mobility/strength. Baseline: will provided Goal status: INITIAL  2.  Pt will decrease overall pain level to </= 4/10 at worst.  Baseline: 8-9/10 worst Goal status: INITIAL  3.  Pt will increase R shoulder strength via MMT to at least 4+/5. Baseline: see flow sheet Goal status: INITIAL  4.  Pt will report decrease in overall headache frequency that is related to neck/shoulder pain. Baseline: at least a few times/month during pre-menstrual cycle Goal status: INITIAL  5.  Pt will be able to work a full day without c/o pain afterward. Baseline: pain at home after work Goal status: INITIAL    PLAN:  PT FREQUENCY: 1-2x/week  PT DURATION: 8 weeks  PLANNED INTERVENTIONS: 97110-Therapeutic exercises, 97530- Therapeutic activity, [id number]- Neuromuscular re-education, 97535- Self Care, 96045- Manual therapy, 97016- Vasopneumatic device, [id number]- Traction (mechanical), [id number]- Ionotophoresis 4mg /ml Dexamethasone, Patient/Family education, Taping, Dry Needling, Spinal manipulation, Spinal mobilization, Cryotherapy, and Moist heat  PLAN FOR NEXT SESSION: DN (discussed with pt during eval and she would like to try at least once),     [facility] April Ma L Curtis Uriarte, PT, DPT 07/07/2023, 8:38 AM

## 2023-07-15 ENCOUNTER — Other Ambulatory Visit: Payer: Self-pay

## 2023-07-15 ENCOUNTER — Ambulatory Visit: Payer: No Typology Code available for payment source

## 2023-07-15 ENCOUNTER — Ambulatory Visit: Payer: No Typology Code available for payment source | Attending: Family Medicine | Admitting: Physical Therapy

## 2023-07-15 DIAGNOSIS — M542 Cervicalgia: Secondary | ICD-10-CM | POA: Diagnosis present

## 2023-07-15 DIAGNOSIS — M6281 Muscle weakness (generalized): Secondary | ICD-10-CM | POA: Insufficient documentation

## 2023-07-15 DIAGNOSIS — R293 Abnormal posture: Secondary | ICD-10-CM | POA: Diagnosis present

## 2023-07-15 NOTE — Therapy (Signed)
 OUTPATIENT PHYSICAL THERAPY CERVICAL TREATMENT   Patient Name: Alicia Werner MRN: [id number] DOB:August 23, 1980, 43 y.o., female Today's Date: 07/15/2023  END OF SESSION:  PT End of Session - 07/15/23 0942     PT Start Time 0843    PT Stop Time 0920    PT Time Calculation (min) 37 min    Activity Tolerance Patient tolerated treatment well    Behavior During Therapy [facility] for tasks assessed/performed               Past Medical History:  Diagnosis Date   Allergy     Vaginal delivery 2008, 2010   Past Surgical History:  Procedure Laterality Date   CESAREAN SECTION     CESAREAN SECTION WITH BILATERAL TUBAL LIGATION N/A 03/28/2014   Procedure: CESAREAN SECTION WITH BILATERAL TUBAL LIGATION;  Surgeon: Jolene Gaskins, MD;  Location: [facility];  Service: Obstetrics;  Laterality: N/A;   FACIAL COSMETIC SURGERY  07/12/2001   Patient Active Problem List   Diagnosis Date Noted   Recurrent sinusitis 11/23/2016   Cesarean delivery delivered 03/28/2014   Allergic rhinitis due to other allergen 09/09/2012   Other chronic allergic conjunctivitis 09/09/2012   Angioedema 09/09/2012   Other atopic dermatitis and related conditions 09/09/2012    PCP: Almarie Waddell NOVAK, NP  REFERRING PROVIDER: Almarie Waddell NOVAK, NP  REFERRING DIAG:  M54.9 (ICD-10-CM) - Upper back pain  M62.838 (ICD-10-CM) - Muscle spasm    THERAPY DIAG:  Cervicalgia  Abnormal posture  Muscle weakness (generalized)  Rationale for Evaluation and Treatment: Rehabilitation  ONSET DATE: 2 months ago 04/12/23  SUBJECTIVE:                                                                                                                                                                                                         SUBJECTIVE STATEMENT: Pt states after eval she has been feeling better. Yesterday she thinks she might have cooked too much had a little bit of pain. States she did do the exercises given to her from PT eval.    Hand dominance: Right  PERTINENT HISTORY:  Cesarean with bilateral tubal ligation 03/28/2014  PAIN:  Are you having pain? Yes: NPRS scale: Current: 4-5/10, Best: 0/10, Worst: 8/9-10/10 Pain location: Medial and inferior R scapula Pain description: When really bad, like a drilling pain; achy otherwise Aggravating factors: pre-menstrual cycle Relieving factors: Yoga (not currently doing it), warm massage oil which husband applies   PRECAUTIONS: None  RED FLAGS: None     WEIGHT BEARING RESTRICTIONS: No  FALLS:  Has patient  fallen in last 6 months? No  LIVING ENVIRONMENT: Lives with: lives with their family Lives in: House/apartment Stairs: Yes: Internal: 13 steps; on right going up and External: 1 steps; none Has following equipment at home: None  OCCUPATION: Nail Technician at [employer]  PLOF: Independent  PATIENT GOALS: pain relief  NEXT MD VISIT: as needed   OBJECTIVE:  Note: Objective measures were completed at Evaluation unless otherwise noted.  POSTURE: rounded shoulders and forward head  PALPATION: TTP R cervical spine, UT, LS, medial and inferior R scapula   CERVICAL ROM:   Active ROM A/PROM (deg) eval  Flexion 50  Extension 60  Right lateral flexion 40  Left lateral flexion 40  Right rotation 60*  Left rotation 75   (Blank rows = not tested)  UPPER EXTREMITY ROM:  Active ROM Right eval Left eval  Shoulder flexion [facility]  Shoulder extension    Shoulder abduction [facility]  Shoulder adduction    Shoulder extension    Shoulder internal rotation T8* WFL  Shoulder external rotation [facility]  Elbow flexion    Elbow extension    Wrist flexion    Wrist extension    Wrist ulnar deviation    Wrist radial deviation    Wrist pronation    Wrist supination     (Blank rows = not tested)  UPPER EXTREMITY MMT:  MMT Right eval Left eval  Shoulder flexion 4/5 5/5  Shoulder extension    Shoulder abduction 4/5 5/5  Shoulder adduction     Shoulder extension    Shoulder internal rotation 4/5 5/5  Shoulder external rotation 4-/5 4+/5  Middle trapezius    Lower trapezius    Elbow flexion    Elbow extension    Wrist flexion    Wrist extension    Wrist ulnar deviation    Wrist radial deviation    Wrist pronation    Wrist supination    Grip strength     (Blank rows = not tested)  CERVICAL SPECIAL TESTS:  Spurling's test: Negative and Distraction test: Negative  FUNCTIONAL TESTS:  NT  TODAY'S TREATMENT:                                                                                                                              DATE:  07/14/22: reassessed pts strength Ue's:  R triceps 4+/5, 5/5 for R shoulder flexion, abd, Er/IR but provokes some lateral upper arm pain/ radiculopathy Cervical spine ROM to R 85% with some tightness/restriction R upper traps region: Manual:Trigger Point Dry Needling  Subsequent Treatment: Instructions provided previously at initial dry needling treatment.  Instructions reviewed, if requested by the patient, prior to subsequent dry needling treatment.   Patient Verbal Consent Given: Yes Education Handout Provided: Previously Provided Muscles Treated: R upper traps, R C5 paraspinals, R infraspinatus Electrical Stimulation Performed: No Treatment Response/Outcome:reduced tissue restriction noted, patient with less pain   Brief deep pressure, cross friction massage  R upper traps and infraspinatus  Therex: reviewed and instructed in the following ex to target her postural musculature: Prone R shoulder horizontal abd 15x Seated Red theraband horizontal abduction Red theraband B shoulder ER Standing green T band R shoulder ext Door frame pec stretches , varying hand position/height  Red t band triceps extension, 15 reps  07/07/23  Child's pose x30 Quadruped thread the needle x30 Quadruped thoracic rotation x30 Sidelying open/close book x10 Sitting scap squeeze x10 Sitting  shoulder external rotation red TB 2x10 Sitting W 2x10 Standing row red TB 2x10 Standing shoulder ext red TB 2x10  Manual therapy: STM & TPR levator scap, periscapular muscles Skilled assessment and palpation for TPDN Trigger Point Dry-Needling  Treatment instructions: Expect mild to moderate muscle soreness. S/S of pneumothorax if dry needled over a lung field, and to seek immediate medical attention should they occur. Patient verbalized understanding of these instructions and education.  Patient Consent Given: Yes Education handout provided: Yes Muscles treated: rhomboid, mid trap, levator scap, thoracic paraspinal Electrical stimulation performed: No Parameters: N/A Treatment response/outcome: Decreased muscle tension  Self care: Self massage with tennis ball    PATIENT EDUCATION:  Education details: Diagnosis, Prognosis, HEP, POC Person educated: Patient Education method: Explanation, Demonstration, Tactile cues, Verbal cues, and Handouts Education comprehension: verbalized understanding, returned demonstration, verbal cues required, tactile cues required, and needs further education  HOME EXERCISE PROGRAM: Access Code: [email or web address] URL: [email or web address] Date: 06/17/2023 Prepared by: Izetta Fordyce  Exercises - Open Books  - 1-2 x daily - 7 x weekly - 1-2 sets - 10 reps - Seated Thoracic Extension with Pectoralis Stretch  - 2 x daily - 7 x weekly - 1 sets - 10 reps - Doorway Pec Stretch at 90 Degrees Abduction  - 2 x daily - 7 x weekly - 2-3 sets - 20 seconds hold - Doorway Pec Stretch at 60 Degrees Abduction with Arm Straight  - 2 x daily - 7 x weekly - 2-3 sets - 20 seconds hold - Seated Scapular Retraction  - 2 x daily - 7 x weekly - 2-3 sets - 10 reps - 5 seconds hold  ASSESSMENT:  CLINICAL IMPRESSION: Patient is a 43 y.o. female who participated today in physical therapy treatment for R shoulder and upper back pain of insidious onset that has worsened  the last 2 months. Pt is a nail technician performing manicures mainly. Reassessment of her strength B Ue's and cervical ROM reveal improved overall R UE strength and cervical spine ROM although still symptomatic R upper traps with R cervical rotation.  Repeated TPDN due to good response last session.  Did attempt R 1st rib depression with cervical R rotation but no relief.  Responded well again to the manual techniques, reinforced maintaining/improving her strength mid and upper thoracic spine musculature, particularly with her repetitive positioning as a nail technician.    She does not know specific biomechanical causes, as her pain is not present until she is resting. She notices her pain pre-menstrual cycle, and it is linked to R-sided migraines. Pt was TTP in TTP R cervical spine, UT, LS, medial and inferior R scapula. Cervical cluster was negative for cervical radiculopathy. Right cervical rotation is mildly painful is decreased, compared to L rotation. R shoulder is weaker with MMT as well in all planes with some upper arm discomfort during flexion and abduction testing. Pt has forward rounding of B shoulders R>L and mild forward head. She is dominant R-handed. Burnard, interpreter present for session. Pt  verbalized understanding and consent to treatment and was agreeable to POC. She will benefit from skilled PT 1-2x/week for 6-8 weeks to address impairments in pain, ROM, strength, and postural deficits.  OBJECTIVE IMPAIRMENTS: decreased ROM, decreased strength, increased fascial restrictions, increased muscle spasms, impaired flexibility, improper body mechanics, postural dysfunction, and pain.     GOALS: Goals reviewed with patient? No  SHORT TERM GOALS: Target date: 07/08/23  Pt will I and compliant with initial HEP. Baseline: provided at eval Goal status: INITIAL  2.  Pt will report a decrease in pain level from 8-9/10 at worst to 6-7/10.  Baseline: 8-9/10 at worst with pre-menstrual  headaches Goal status: INITIAL  LONG TERM GOALS: Target date: 08/25/22  Pt will independent with long term HEP to address posture, any recurrence, and maintain mobility/strength. Baseline: will provided Goal status: INITIAL  2.  Pt will decrease overall pain level to </= 4/10 at worst.  Baseline: 8-9/10 worst Goal status: INITIAL  3.  Pt will increase R shoulder strength via MMT to at least 4+/5. Baseline: see flow sheet Goal status: INITIAL  4.  Pt will report decrease in overall headache frequency that is related to neck/shoulder pain. Baseline: at least a few times/month during pre-menstrual cycle Goal status: INITIAL  5.  Pt will be able to work a full day without c/o pain afterward. Baseline: pain at home after work Goal status: INITIAL    PLAN:  PT FREQUENCY: 1-2x/week  PT DURATION: 8 weeks  PLANNED INTERVENTIONS: 97110-Therapeutic exercises, 97530- Therapeutic activity, [id number]- Neuromuscular re-education, 97535- Self Care, [id number]- Manual therapy, 97016- Vasopneumatic device, [id number]- Traction (mechanical), [id number]- Ionotophoresis 4mg /ml Dexamethasone, Patient/Family education, Taping, Dry Needling, Spinal manipulation, Spinal mobilization, Cryotherapy, and Moist heat  PLAN FOR NEXT SESSION: DN (discussed with pt during eval and she would like to try at least once), review and advance therex, continue to reassess R UE strength    Kyrstin Campillo L Xitlali Kastens, PT, DPT 07/15/2023, 9:43 AM

## 2023-07-22 ENCOUNTER — Encounter: Payer: Self-pay | Admitting: Physical Therapy

## 2023-07-22 ENCOUNTER — Ambulatory Visit: Payer: No Typology Code available for payment source | Admitting: Physical Therapy

## 2023-07-22 DIAGNOSIS — R293 Abnormal posture: Secondary | ICD-10-CM

## 2023-07-22 DIAGNOSIS — M542 Cervicalgia: Secondary | ICD-10-CM | POA: Diagnosis not present

## 2023-07-22 DIAGNOSIS — M6281 Muscle weakness (generalized): Secondary | ICD-10-CM

## 2023-07-22 NOTE — Therapy (Signed)
 OUTPATIENT PHYSICAL THERAPY CERVICAL TREATMENT   Patient Name: Alicia Werner MRN: [id number] DOB:08/22/1980, 43 y.o., female Today's Date: 07/22/2023  END OF SESSION:  PT End of Session - 07/22/23 0811     Visit Number 4    Date for PT Re-Evaluation 08/26/23    PT Start Time 0807    PT Stop Time 0845    PT Time Calculation (min) 38 min    Activity Tolerance Patient tolerated treatment well    Behavior During Therapy [facility] for tasks assessed/performed            Past Medical History:  Diagnosis Date   Allergy     Vaginal delivery 2008, 2010   Past Surgical History:  Procedure Laterality Date   CESAREAN SECTION     CESAREAN SECTION WITH BILATERAL TUBAL LIGATION N/A 03/28/2014   Procedure: CESAREAN SECTION WITH BILATERAL TUBAL LIGATION;  Surgeon: Jolene Gaskins, MD;  Location: [facility];  Service: Obstetrics;  Laterality: N/A;   FACIAL COSMETIC SURGERY  07/12/2001   Patient Active Problem List   Diagnosis Date Noted   Recurrent sinusitis 11/23/2016   Cesarean delivery delivered 03/28/2014   Allergic rhinitis due to other allergen 09/09/2012   Other chronic allergic conjunctivitis 09/09/2012   Angioedema 09/09/2012   Other atopic dermatitis and related conditions 09/09/2012    PCP: Almarie Waddell NOVAK, NP  REFERRING PROVIDER: Almarie Waddell NOVAK, NP  REFERRING DIAG:  M54.9 (ICD-10-CM) - Upper back pain  M62.838 (ICD-10-CM) - Muscle spasm    THERAPY DIAG:  Cervicalgia  Abnormal posture  Muscle weakness (generalized)  Rationale for Evaluation and Treatment: Rehabilitation  ONSET DATE: 2 months ago 04/12/23  SUBJECTIVE:                                                                                                                                                                                                         SUBJECTIVE STATEMENT: Pt reports that she is continuing to feel better overall. Feels the DN is helpful.  Hand dominance: Right  PERTINENT HISTORY:  Cesarean  with bilateral tubal ligation 03/28/2014  PAIN:  Are you having pain? Yes: NPRS scale: Current: 4-5/10, Best: 0/10, Worst: 8/9-10/10 Pain location: Medial and inferior R scapula Pain description: When really bad, like a drilling pain; achy otherwise Aggravating factors: pre-menstrual cycle Relieving factors: Yoga (not currently doing it), warm massage oil which husband applies   PRECAUTIONS: None  RED FLAGS: None     WEIGHT BEARING RESTRICTIONS: No  FALLS:  Has patient fallen in last 6 months? No  LIVING ENVIRONMENT: Lives with:  lives with their family Lives in: House/apartment Stairs: Yes: Internal: 13 steps; on right going up and External: 1 steps; none Has following equipment at home: None  OCCUPATION: Nail Technician at [employer]  PLOF: Independent  PATIENT GOALS: pain relief  NEXT MD VISIT: as needed   OBJECTIVE:  Note: Objective measures were completed at Evaluation unless otherwise noted.  POSTURE: rounded shoulders and forward head  PALPATION: TTP R cervical spine, UT, LS, medial and inferior R scapula   CERVICAL ROM:   Active ROM A/PROM (deg) eval  Flexion 50  Extension 60  Right lateral flexion 40  Left lateral flexion 40  Right rotation 60*  Left rotation 75   (Blank rows = not tested)  UPPER EXTREMITY ROM:  Active ROM Right eval Left eval  Shoulder flexion [facility]  Shoulder extension    Shoulder abduction [facility]  Shoulder adduction    Shoulder extension    Shoulder internal rotation T8* WFL  Shoulder external rotation [facility]  Elbow flexion    Elbow extension    Wrist flexion    Wrist extension    Wrist ulnar deviation    Wrist radial deviation    Wrist pronation    Wrist supination     (Blank rows = not tested)  UPPER EXTREMITY MMT:  MMT Right eval Left eval  Shoulder flexion 4/5 5/5  Shoulder extension    Shoulder abduction 4/5 5/5  Shoulder adduction    Shoulder extension    Shoulder internal rotation 4/5 5/5   Shoulder external rotation 4-/5 4+/5  Middle trapezius    Lower trapezius    Elbow flexion    Elbow extension    Wrist flexion    Wrist extension    Wrist ulnar deviation    Wrist radial deviation    Wrist pronation    Wrist supination    Grip strength     (Blank rows = not tested)  CERVICAL SPECIAL TESTS:  Spurling's test: Negative and Distraction test: Negative  FUNCTIONAL TESTS:  NT  TODAY'S TREATMENT:                                                                                                                              DATE:  07/22/23 UBE L1 x 3 min for and back Seated press ups, 5 sec hold, x 10 reps Standing shoulder ext, row, ER against red Tband with chin tuck to hold ball on wall, 10 reps each. 3 way shoulder elevation with 2# weights x 10 reps. STM to cerv paraspinals, R UT, LS with stretch.   07/14/22: reassessed pts strength Ue's:  R triceps 4+/5, 5/5 for R shoulder flexion, abd, Er/IR but provokes some lateral upper arm pain/ radiculopathy Cervical spine ROM to R 85% with some tightness/restriction R upper traps region: Manual:Trigger Point Dry Needling  Subsequent Treatment: Instructions provided previously at initial dry needling treatment.  Instructions reviewed, if requested by the patient, prior to  subsequent dry needling treatment.   Patient Verbal Consent Given: Yes Education Handout Provided: Previously Provided Muscles Treated: R upper traps, R C5 paraspinals, R infraspinatus Electrical Stimulation Performed: No Treatment Response/Outcome:reduced tissue restriction noted, patient with less pain   Brief deep pressure, cross friction massage R upper traps and infraspinatus  Therex: reviewed and instructed in the following ex to target her postural musculature: Prone R shoulder horizontal abd 15x Seated Red theraband horizontal abduction Red theraband B shoulder ER Standing green T band R shoulder ext Door frame pec stretches , varying hand  position/height  Red t band triceps extension, 15 reps  07/07/23  Child's pose x30 Quadruped thread the needle x30 Quadruped thoracic rotation x30 Sidelying open/close book x10 Sitting scap squeeze x10 Sitting shoulder external rotation red TB 2x10 Sitting W 2x10 Standing row red TB 2x10 Standing shoulder ext red TB 2x10  Manual therapy: STM & TPR levator scap, periscapular muscles Skilled assessment and palpation for TPDN Trigger Point Dry-Needling  Treatment instructions: Expect mild to moderate muscle soreness. S/S of pneumothorax if dry needled over a lung field, and to seek immediate medical attention should they occur. Patient verbalized understanding of these instructions and education.  Patient Consent Given: Yes Education handout provided: Yes Muscles treated: rhomboid, mid trap, levator scap, thoracic paraspinal Electrical stimulation performed: No Parameters: N/A Treatment response/outcome: Decreased muscle tension  Self care: Self massage with tennis ball    PATIENT EDUCATION:  Education details: Diagnosis, Prognosis, HEP, POC Person educated: Patient Education method: Explanation, Demonstration, Tactile cues, Verbal cues, and Handouts Education comprehension: verbalized understanding, returned demonstration, verbal cues required, tactile cues required, and needs further education  HOME EXERCISE PROGRAM: Access Code: [email or web address] URL: [email or web address] Date: 06/17/2023 Prepared by: Izetta Fordyce  Exercises - Open Books  - 1-2 x daily - 7 x weekly - 1-2 sets - 10 reps - Seated Thoracic Extension with Pectoralis Stretch  - 2 x daily - 7 x weekly - 1 sets - 10 reps - Doorway Pec Stretch at 90 Degrees Abduction  - 2 x daily - 7 x weekly - 2-3 sets - 20 seconds hold - Doorway Pec Stretch at 60 Degrees Abduction with Arm Straight  - 2 x daily - 7 x weekly - 2-3 sets - 20 seconds hold - Seated Scapular Retraction  - 2 x daily - 7 x weekly - 2-3  sets - 10 reps - 5 seconds hold  ASSESSMENT:  CLINICAL IMPRESSION: Patient is a 43 y.o. female who participated today in physical therapy treatment for R shoulder and upper back pain of insidious onset that has worsened the last 2 months. Pt is a nail technician performing manicures mainly. Treatment focused on progressing her ROM, mobility in her neck and Thoracic spine as well as postural strengthening. Deep STM to knot in her R UT with relief noted.    She does not know specific biomechanical causes, as her pain is not present until she is resting. She notices her pain pre-menstrual cycle, and it is linked to R-sided migraines. Pt was TTP in TTP R cervical spine, UT, LS, medial and inferior R scapula. Cervical cluster was negative for cervical radiculopathy. Right cervical rotation is mildly painful is decreased, compared to L rotation. R shoulder is weaker with MMT as well in all planes with some upper arm discomfort during flexion and abduction testing. Pt has forward rounding of B shoulders R>L and mild forward head. She is dominant R-handed. Burnard, interpreter present for session. Pt verbalized understanding and  consent to treatment and was agreeable to POC. She will benefit from skilled PT 1-2x/week for 6-8 weeks to address impairments in pain, ROM, strength, and postural deficits.  OBJECTIVE IMPAIRMENTS: decreased ROM, decreased strength, increased fascial restrictions, increased muscle spasms, impaired flexibility, improper body mechanics, postural dysfunction, and pain.     GOALS: Goals reviewed with patient? No  SHORT TERM GOALS: Target date: 07/08/23  Pt will I and compliant with initial HEP. Baseline: provided at eval Goal status: INITIAL  2.  Pt will report a decrease in pain level from 8-9/10 at worst to 6-7/10.  Baseline: 8-9/10 at worst with pre-menstrual headaches Goal status: INITIAL  LONG TERM GOALS: Target date: 08/25/22  Pt will independent with long term HEP to  address posture, any recurrence, and maintain mobility/strength. Baseline: will provided Goal status: INITIAL  2.  Pt will decrease overall pain level to </= 4/10 at worst.  Baseline: 8-9/10 worst Goal status: 07/22/23-Improving, but not met, ongoing.  3.  Pt will increase R shoulder strength via MMT to at least 4+/5. Baseline: see flow sheet Goal status: INITIAL  4.  Pt will report decrease in overall headache frequency that is related to neck/shoulder pain. Baseline: at least a few times/month during pre-menstrual cycle Goal status: INITIAL  5.  Pt will be able to work a full day without c/o pain afterward. Baseline: pain at home after work Goal status: INITIAL    PLAN:  PT FREQUENCY: 1-2x/week  PT DURATION: 8 weeks  PLANNED INTERVENTIONS: 97110-Therapeutic exercises, 97530- Therapeutic activity, [id number]- Neuromuscular re-education, 97535- Self Care, 02859- Manual therapy, 97016- Vasopneumatic device, [id number]- Traction (mechanical), [phone]- Ionotophoresis 4mg /ml Dexamethasone, Patient/Family education, Taping, Dry Needling, Spinal manipulation, Spinal mobilization, Cryotherapy, and Moist heat  PLAN FOR NEXT SESSION: DN (discussed with pt during eval and she would like to try at least once), review and advance therex, continue to reassess R UE strength    Devere CHRISTELLA Mean, PT, DPT 07/22/2023, 8:45 AM

## 2023-07-26 IMAGING — MG MM DIGITAL SCREENING BILAT W/ TOMO AND CAD
6 of 10 series · 6 of 30 positions shown · non-contrast
Comparison: Previous exam(s).

CLINICAL DATA: Screening.

EXAM:
DIGITAL SCREENING BILATERAL MAMMOGRAM WITH TOMOSYNTHESIS AND CAD
TECHNIQUE: Bilateral screening digital craniocaudal and mediolateral oblique
mammograms were obtained. Bilateral screening digital breast
tomosynthesis was performed. The images were evaluated with
computer-aided detection.

[L CC synth-2D]
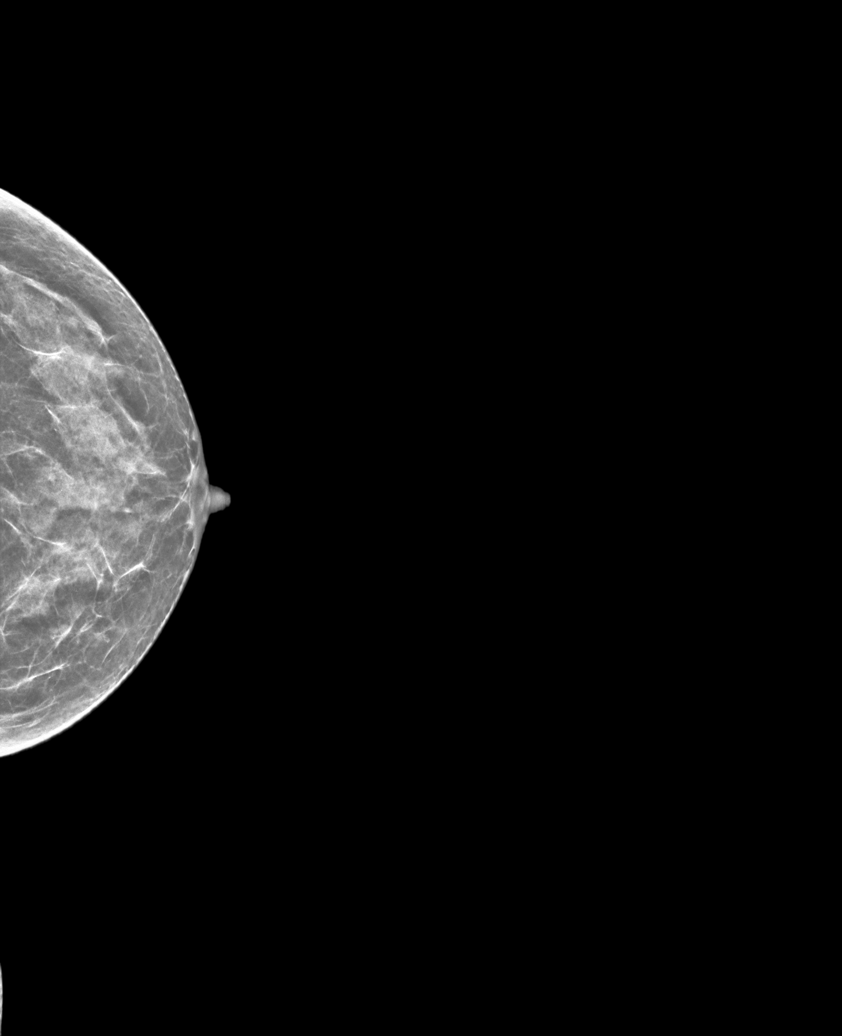

[R MLO synth-2D (1 of 2)]
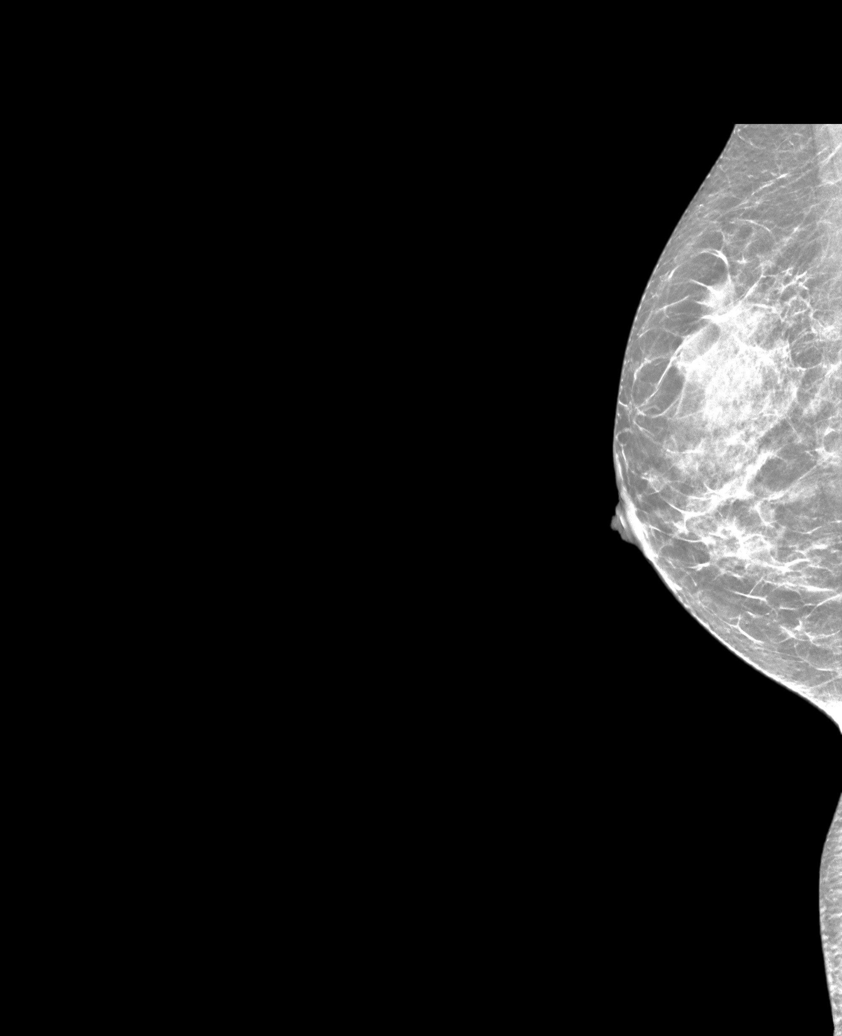

[L MLO synth-2D]
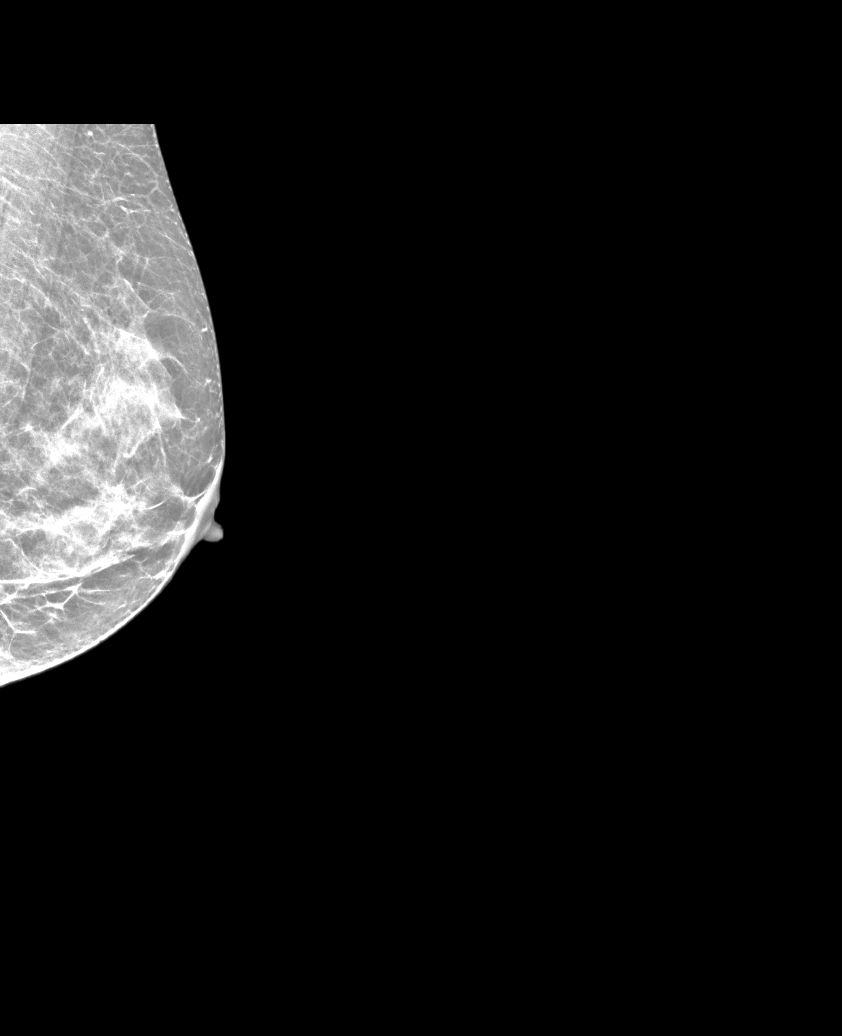

[R MLO synth-2D (2 of 2)]
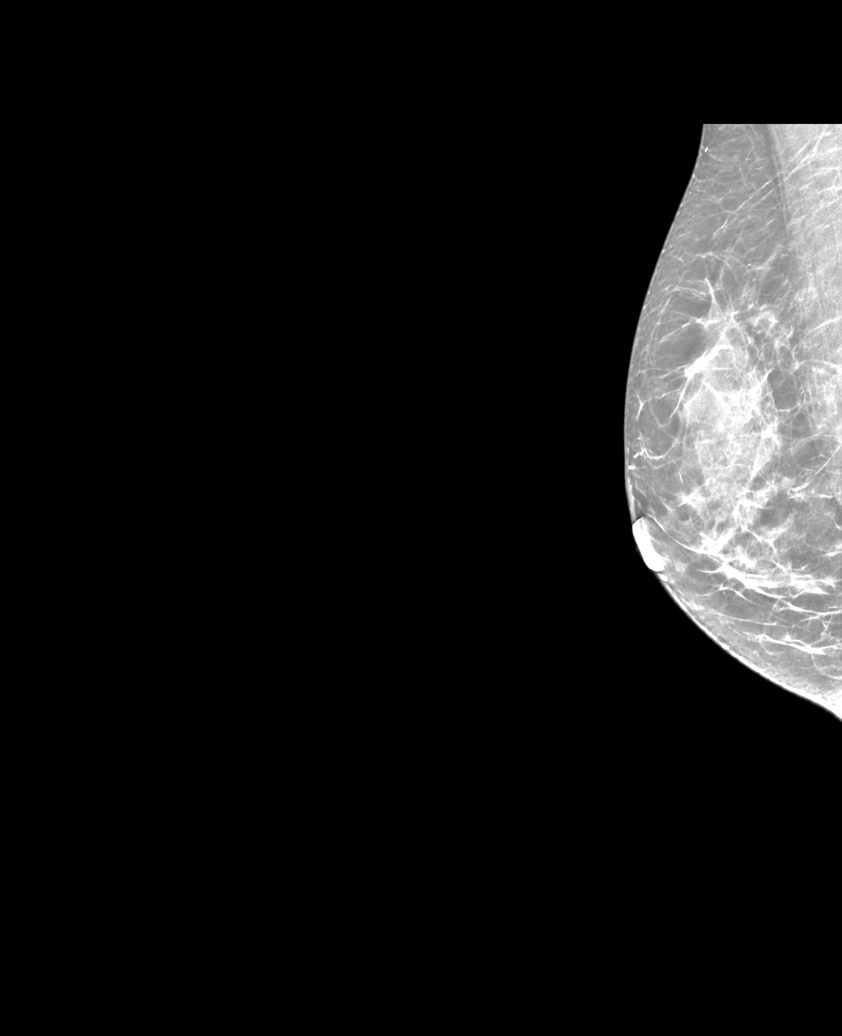

[R CC synth-2D]
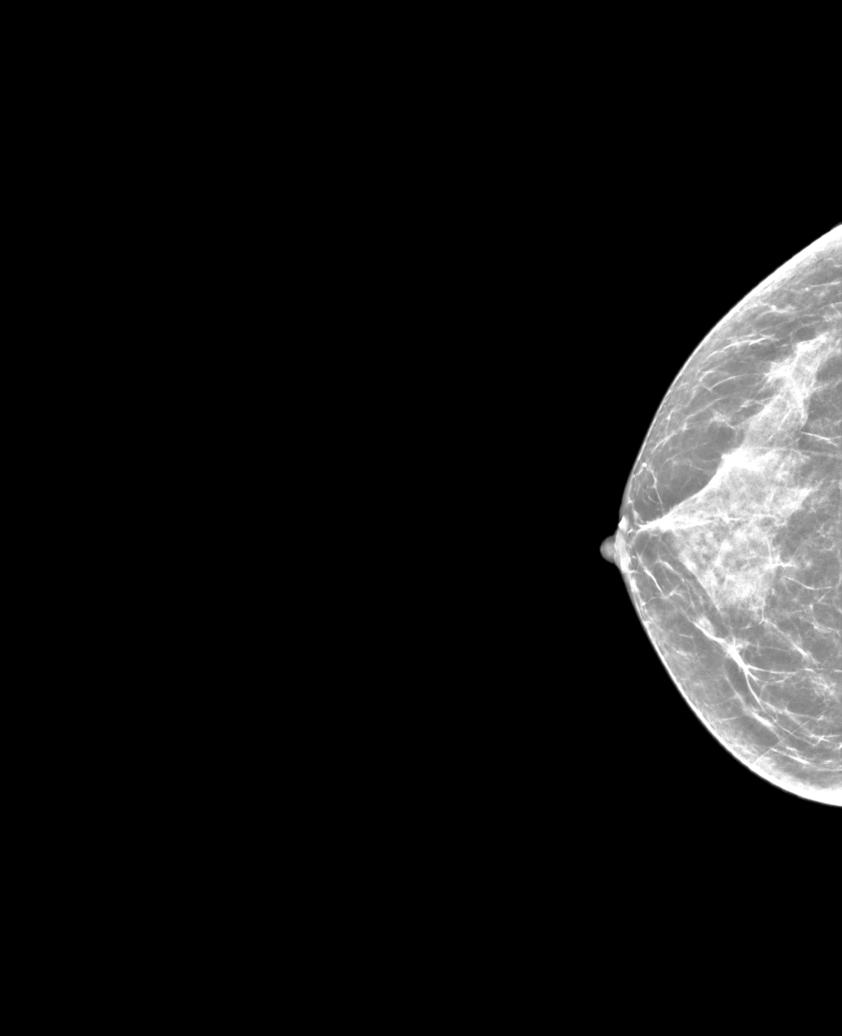

[R MLO tomo · tomo slice 27/52.0]
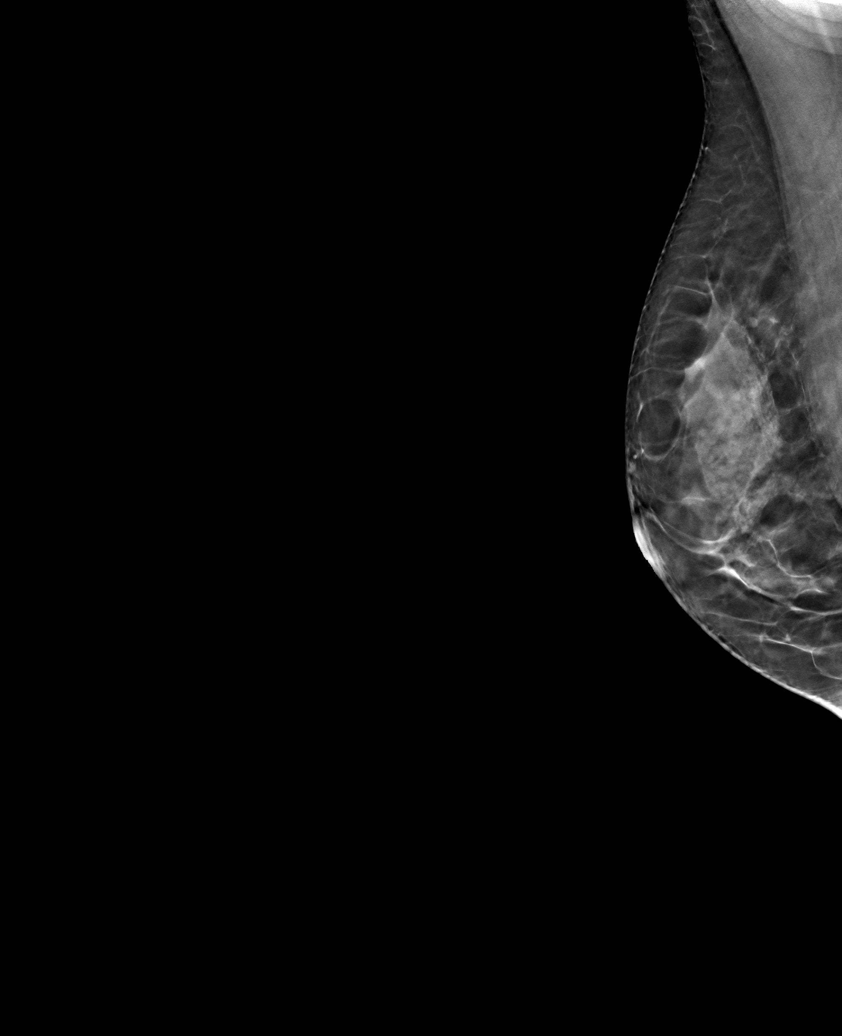

[6 of 30 positions shown; findings below may reference images not displayed]

ACR Breast Density Category c: The breast tissue is heterogeneously
dense, which may obscure small masses.
FINDINGS: There are no findings suspicious for malignancy.
IMPRESSION: No mammographic evidence of malignancy. A result letter of this
screening mammogram will be mailed directly to the patient.

RECOMMENDATION:
Screening mammogram in one year. (Code:[id number])

BI-RADS CATEGORY  1: Negative.

## 2023-07-29 ENCOUNTER — Encounter: Payer: Self-pay | Admitting: Physical Therapy

## 2023-07-29 ENCOUNTER — Ambulatory Visit: Payer: No Typology Code available for payment source | Admitting: Physical Therapy

## 2023-07-29 DIAGNOSIS — M542 Cervicalgia: Secondary | ICD-10-CM

## 2023-07-29 DIAGNOSIS — M6281 Muscle weakness (generalized): Secondary | ICD-10-CM

## 2023-07-29 DIAGNOSIS — R293 Abnormal posture: Secondary | ICD-10-CM

## 2023-07-29 NOTE — Therapy (Signed)
OUTPATIENT PHYSICAL THERAPY CERVICAL TREATMENT   Patient Name: Alicia Werner MRN: [id number] DOB:11-24-1980, 43 y.o., female Today's Date: 07/29/2023  END OF SESSION:  PT End of Session - 07/29/23 0808     Visit Number 5    Date for PT Re-Evaluation 08/26/23    PT Start Time 0805    PT Stop Time 0843    PT Time Calculation (min) 38 min             Past Medical History:  Diagnosis Date   Allergy    Vaginal delivery 2008, 2010   Past Surgical History:  Procedure Laterality Date   CESAREAN SECTION     CESAREAN SECTION WITH BILATERAL TUBAL LIGATION N/A 03/28/2014   Procedure: CESAREAN SECTION WITH BILATERAL TUBAL LIGATION;  Surgeon: Marlow Baars, MD;  Location: [facility];  Service: Obstetrics;  Laterality: N/A;   FACIAL COSMETIC SURGERY  07/12/2001   Patient Active Problem List   Diagnosis Date Noted   Recurrent sinusitis 11/23/2016   Cesarean delivery delivered 03/28/2014   Allergic rhinitis due to other allergen 09/09/2012   Other chronic allergic conjunctivitis 09/09/2012   Angioedema 09/09/2012   Other atopic dermatitis and related conditions 09/09/2012    PCP: Clayborne Dana, NP  REFERRING PROVIDER: Clayborne Dana, NP  REFERRING DIAG:  M54.9 (ICD-10-CM) - Upper back pain  M62.838 (ICD-10-CM) - Muscle spasm    THERAPY DIAG:  Cervicalgia  Abnormal posture  Muscle weakness (generalized)  Rationale for Evaluation and Treatment: Rehabilitation  ONSET DATE: 2 months ago 04/12/23  SUBJECTIVE:                                                                                                                                                                                                         SUBJECTIVE STATEMENT: Pt reports that she is continuing to feel better overall. She feels almost normal.  Hand dominance: Right  PERTINENT HISTORY:  Cesarean with bilateral tubal ligation 03/28/2014  PAIN:  Are you having pain? Yes: NPRS scale: Current: 4-5/10, Best:  0/10, Worst: 8/9-10/10 Pain location: Medial and inferior R scapula Pain description: When really bad, like a drilling pain; achy otherwise Aggravating factors: pre-menstrual cycle Relieving factors: Yoga (not currently doing it), warm massage oil which husband applies   PRECAUTIONS: None  RED FLAGS: None     WEIGHT BEARING RESTRICTIONS: No  FALLS:  Has patient fallen in last 6 months? 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  PLOF: Independent  PATIENT GOALS: pain relief  NEXT MD VISIT: as needed   OBJECTIVE:  Note: Objective measures were completed at Evaluation unless otherwise noted.  POSTURE: rounded shoulders and forward head  PALPATION: TTP R cervical spine, UT, LS, medial and inferior R scapula   CERVICAL ROM:   Active ROM A/PROM (deg) eval  Flexion 50  Extension 60  Right lateral flexion 40  Left lateral flexion 40  Right rotation 60*  Left rotation 75   (Blank rows = not tested)  UPPER EXTREMITY ROM:  Active ROM Right eval Left eval  Shoulder flexion [facility]  Shoulder extension    Shoulder abduction [facility]  Shoulder adduction    Shoulder extension    Shoulder internal rotation T8* WFL  Shoulder external rotation [facility]  Elbow flexion    Elbow extension    Wrist flexion    Wrist extension    Wrist ulnar deviation    Wrist radial deviation    Wrist pronation    Wrist supination     (Blank rows = not tested)  UPPER EXTREMITY MMT:  MMT Right eval Left eval  Shoulder flexion 4/5 5/5  Shoulder extension    Shoulder abduction 4/5 5/5  Shoulder adduction    Shoulder extension    Shoulder internal rotation 4/5 5/5  Shoulder external rotation 4-/5 4+/5  Middle trapezius    Lower trapezius    Elbow flexion    Elbow extension     Wrist flexion    Wrist extension    Wrist ulnar deviation    Wrist radial deviation    Wrist pronation    Wrist supination    Grip strength     (Blank rows = not tested)  CERVICAL SPECIAL TESTS:  Spurling's test: Negative and Distraction test: Negative  FUNCTIONAL TESTS:  NT  TODAY'S TREATMENT:                                                                                                                              DATE:  07/29/23 UBE L1 x 3 min forward and back Prone over physioball, shoulder ext, horizontal abd, flex in scaption. X 10 each 1# weights AR press, 10#, 2 x 10 reps each side. Lat pulls, 20#, 2 x 10 Rows, 20#, 2 x 10 reps Seated dips, partial, x 10 Deep pressure and STM to R UT, particularly on trigger point, F/B stretch  07/22/23 UBE L1 x 3 min for and back Seated press ups, 5 sec hold, x 10 reps Standing shoulder ext, row, ER against red Tband with chin tuck to hold ball on wall, 10 reps each. 3 way shoulder elevation with 2# weights x 10 reps. STM to cerv paraspinals, R UT, LS with stretch.   07/14/22: reassessed pts strength Ue's:  R triceps 4+/5, 5/5 for R shoulder flexion, abd, Er/IR but  provokes some lateral upper arm pain/ radiculopathy Cervical spine ROM to R 85% with some tightness/restriction R upper traps region: Manual:Trigger Point Dry Needling  Subsequent Treatment: Instructions provided previously at initial dry needling treatment.  Instructions reviewed, if requested by the patient, prior to subsequent dry needling treatment.   Patient Verbal Consent Given: Yes Education Handout Provided: Previously Provided Muscles Treated: R upper traps, R C5 paraspinals, R infraspinatus Electrical Stimulation Performed: No Treatment Response/Outcome:reduced tissue restriction noted, patient with less pain   Brief deep pressure, cross friction massage R upper traps and infraspinatus  Therex: reviewed and instructed in the following ex to target her  postural musculature: Prone R shoulder horizontal abd 15x Seated Red theraband horizontal abduction Red theraband B shoulder ER Standing green T band R shoulder ext Door frame pec stretches , varying hand position/height  Red t band triceps extension, 15 reps  07/07/23  Child's pose x30" Quadruped thread the needle x30" Quadruped thoracic rotation x30" Sidelying open/close book x10 Sitting scap squeeze x10 Sitting shoulder external rotation red TB 2x10 Sitting "W" 2x10 Standing row red TB 2x10 Standing shoulder ext red TB 2x10  Manual therapy: STM & TPR levator scap, periscapular muscles Skilled assessment and palpation for TPDN Trigger Point Dry-Needling  Treatment instructions: Expect mild to moderate muscle soreness. S/S of pneumothorax if dry needled over a lung field, and to seek immediate medical attention should they occur. Patient verbalized understanding of these instructions and education.  Patient Consent Given: Yes Education handout provided: Yes Muscles treated: rhomboid, mid trap, levator scap, thoracic paraspinal Electrical stimulation performed: No Parameters: N/A Treatment response/outcome: Decreased muscle tension  Self care: Self massage with tennis ball    PATIENT EDUCATION:  Education details: Diagnosis, Prognosis, HEP, POC Person educated: Patient Education method: Explanation, Demonstration, Tactile cues, Verbal cues, and Handouts Education comprehension: verbalized understanding, returned demonstration, verbal cues required, tactile cues required, and needs further education  HOME EXERCISE PROGRAM: Access Code: [email or web address] URL: [email or web address] Date: 06/17/2023 Prepared by: Gardiner Rhyme  Exercises - Open Books  - 1-2 x daily - 7 x weekly - 1-2 sets - 10 reps - Seated Thoracic Extension with Pectoralis Stretch  - 2 x daily - 7 x weekly - 1 sets - 10 reps - Doorway Pec Stretch at 90 Degrees Abduction  - 2 x daily - 7 x weekly -  2-3 sets - 20 seconds hold - Doorway Pec Stretch at 60 Degrees Abduction with Arm Straight  - 2 x daily - 7 x weekly - 2-3 sets - 20 seconds hold - Seated Scapular Retraction  - 2 x daily - 7 x weekly - 2-3 sets - 10 reps - 5 seconds hold  ASSESSMENT:  CLINICAL IMPRESSION: Patient is a 43 y.o. female who participated today in physical therapy treatment for R shoulder and upper back pain of insidious onset that has worsened the last 2 months. Pt is a nail technician performing manicures mainly.  Patient reports improved pain control. She uses the exercises when she feels tight and it helps relieve the strain. Progressed treatment with increased strengthening activities, finish with deep STM, TP treatment. The TP in her R UT remains, but did respond more quickly to the pressure with partial release.  She does not know specific biomechanical causes, as her pain is not present until she is resting. She notices her pain pre-menstrual cycle, and it is linked to R-sided migraines. Pt was TTP in TTP R cervical spine, UT, LS, medial and inferior R scapula. Cervical  cluster was negative for cervical radiculopathy. Right cervical rotation is mildly painful is decreased, compared to L rotation. R shoulder is weaker with MMT as well in all planes with some upper arm discomfort during flexion and abduction testing. Pt has forward rounding of B shoulders R>L and mild forward head. She is dominant R-handed. Tresa Endo, interpreter present for session. Pt verbalized understanding and consent to treatment and was agreeable to POC. She will benefit from skilled PT 1-2x/week for 6-8 weeks to address impairments in pain, ROM, strength, and postural deficits.  OBJECTIVE IMPAIRMENTS: decreased ROM, decreased strength, increased fascial restrictions, increased muscle spasms, impaired flexibility, improper body mechanics, postural dysfunction, and pain.     GOALS: Goals reviewed with patient? No  SHORT TERM GOALS: Target date:  07/08/23  Pt will I and compliant with initial HEP. Baseline: provided at eval Goal status: INITIAL  2.  Pt will report a decrease in pain level from 8-9/10 at worst to 6-7/10.  Baseline: 8-9/10 at worst with pre-menstrual headaches Goal status: INITIAL  LONG TERM GOALS: Target date: 08/25/22  Pt will independent with long term HEP to address posture, any recurrence, and maintain mobility/strength. Baseline: will provided Goal status: INITIAL  2.  Pt will decrease overall pain level to </= 4/10 at worst.  Baseline: 8-9/10 worst Goal status: 07/22/23-Improving, but not met, ongoing.  3.  Pt will increase R shoulder strength via MMT to at least 4+/5. Baseline: see flow sheet Goal status: INITIAL  4.  Pt will report decrease in overall headache frequency that is related to neck/shoulder pain. Baseline: at least a few times/month during pre-menstrual cycle Goal status: INITIAL  5.  Pt will be able to work a full day without c/o pain afterward. Baseline: pain at home after work Goal status: INITIAL    PLAN:  PT FREQUENCY: 1-2x/week  PT DURATION: 8 weeks  PLANNED INTERVENTIONS: 97110-Therapeutic exercises, 97530- Therapeutic activity, [id number]- Neuromuscular re-education, 97535- Self Care, 53664- Manual therapy, 97016- Vasopneumatic device, [id number]- Traction (mechanical), [phone]- Ionotophoresis 4mg /ml Dexamethasone, Patient/Family education, Taping, Dry Needling, Spinal manipulation, Spinal mobilization, Cryotherapy, and Moist heat  PLAN FOR NEXT SESSION: DN (discussed with pt during eval and she would like to try at least once), review and advance therex, continue to reassess R UE strength    Iona Beard, PT, DPT 07/29/2023, 8:42 AM

## 2023-08-05 ENCOUNTER — Ambulatory Visit: Payer: No Typology Code available for payment source

## 2023-08-05 DIAGNOSIS — M6281 Muscle weakness (generalized): Secondary | ICD-10-CM

## 2023-08-05 DIAGNOSIS — M542 Cervicalgia: Secondary | ICD-10-CM

## 2023-08-05 DIAGNOSIS — R293 Abnormal posture: Secondary | ICD-10-CM

## 2023-08-05 NOTE — Therapy (Signed)
OUTPATIENT PHYSICAL THERAPY CERVICAL TREATMENT   Patient Name: Alicia Werner MRN: [id number] DOB:1980-10-09, 43 y.o., female Today's Date: 08/05/2023  END OF SESSION:  PT End of Session - 08/05/23 0807     Visit Number 6    Number of Visits 8    Date for PT Re-Evaluation 08/26/23    PT Start Time 0805    PT Stop Time 0845    PT Time Calculation (min) 40 min    Activity Tolerance Patient tolerated treatment well    Behavior During Therapy [facility] for tasks assessed/performed              Past Medical History:  Diagnosis Date   Allergy    Vaginal delivery 2008, 2010   Past Surgical History:  Procedure Laterality Date   CESAREAN SECTION     CESAREAN SECTION WITH BILATERAL TUBAL LIGATION N/A 03/28/2014   Procedure: CESAREAN SECTION WITH BILATERAL TUBAL LIGATION;  Surgeon: Marlow Baars, MD;  Location: [facility];  Service: Obstetrics;  Laterality: N/A;   FACIAL COSMETIC SURGERY  07/12/2001   Patient Active Problem List   Diagnosis Date Noted   Recurrent sinusitis 11/23/2016   Cesarean delivery delivered 03/28/2014   Allergic rhinitis due to other allergen 09/09/2012   Other chronic allergic conjunctivitis 09/09/2012   Angioedema 09/09/2012   Other atopic dermatitis and related conditions 09/09/2012    PCP: Clayborne Dana, NP  REFERRING PROVIDER: Clayborne Dana, NP  REFERRING DIAG:  M54.9 (ICD-10-CM) - Upper back pain  M62.838 (ICD-10-CM) - Muscle spasm    THERAPY DIAG:  Cervicalgia  Abnormal posture  Muscle weakness (generalized)  Rationale for Evaluation and Treatment: Rehabilitation  ONSET DATE: 2 months ago 04/12/23  SUBJECTIVE:                                                                                                                                                                                                         SUBJECTIVE STATEMENT: Pt reports that she is ok, had some pain above shoulder blade  Hand dominance: Right  PERTINENT HISTORY:   Cesarean with bilateral tubal ligation 03/28/2014  PAIN:  Are you having pain? Yes: NPRS scale: Current: 2/10, Best: 0/10, Worst: 8/9-10/10 Pain location: Medial and inferior R scapula Pain description: When really bad, like a drilling pain; achy otherwise Aggravating factors: pre-menstrual cycle Relieving factors: Yoga (not currently doing it), warm massage oil which husband applies   PRECAUTIONS: None  RED FLAGS: None     WEIGHT BEARING RESTRICTIONS: No  FALLS:  Has patient fallen in last 6 months?  No  LIVING ENVIRONMENT: Lives with: lives with their family Lives in: House/apartment Stairs: Yes: Internal: 13 steps; on right going up and External: 1 steps; none Has following equipment at home: None  OCCUPATION: Nail Technician at [employer]  PLOF: Independent  PATIENT GOALS: pain relief  NEXT MD VISIT: as needed   OBJECTIVE:  Note: Objective measures were completed at Evaluation unless otherwise noted.  POSTURE: rounded shoulders and forward head  PALPATION: TTP R cervical spine, UT, LS, medial and inferior R scapula   CERVICAL ROM:   Active ROM A/PROM (deg) eval  Flexion 50  Extension 60  Right lateral flexion 40  Left lateral flexion 40  Right rotation 60*  Left rotation 75   (Blank rows = not tested)  UPPER EXTREMITY ROM:  Active ROM Right eval Left eval  Shoulder flexion [facility]  Shoulder extension    Shoulder abduction [facility]  Shoulder adduction    Shoulder extension    Shoulder internal rotation T8* WFL  Shoulder external rotation [facility]  Elbow flexion    Elbow extension    Wrist flexion    Wrist extension    Wrist ulnar deviation    Wrist radial deviation    Wrist pronation    Wrist supination     (Blank rows = not tested)  UPPER EXTREMITY MMT:  MMT Right eval Left eval  Shoulder flexion 4/5 5/5  Shoulder extension    Shoulder abduction 4/5 5/5  Shoulder adduction    Shoulder extension    Shoulder internal rotation 4/5  5/5  Shoulder external rotation 4-/5 4+/5  Middle trapezius    Lower trapezius    Elbow flexion    Elbow extension    Wrist flexion    Wrist extension    Wrist ulnar deviation    Wrist radial deviation    Wrist pronation    Wrist supination    Grip strength     (Blank rows = not tested)  CERVICAL SPECIAL TESTS:  Spurling's test: Negative and Distraction test: Negative  FUNCTIONAL TESTS:  NT  TODAY'S TREATMENT:                                                                                                                              DATE:   [facility] Adult PT Treatment:                                                DATE: 08/05/23 Therapeutic Exercise: STM with firm ball superior to R scap --> shoulder flexion x10, abduction x10, circles x8 B FR T/S ext 2x10 then rolling FR suboccipital release  QP HBH T/S rotation x8 B then thread the needle S hold x15" each QP UE reach x 5 B, LE reach x5 B, bird dog x5 B Child's pose forward  and sidebending Rows 20# 2x10 cues for grip, shoulder depression/UT relaxation LAT Pulls 25# 2x10 cues for grip and shoulder depression (2nd set, wide grip) Elevated plank on table x20", side plank B x20" Serratus press on table x10    07/29/23 UBE L1 x 3 min forward and back Prone over physioball, shoulder ext, horizontal abd, flex in scaption. X 10 each 1# weights AR press, 10#, 2 x 10 reps each side. Lat pulls, 20#, 2 x 10 Rows, 20#, 2 x 10 reps Seated dips, partial, x 10 Deep pressure and STM to R UT, particularly on trigger point, F/B stretch  07/22/23 UBE L1 x 3 min for and back Seated press ups, 5 sec hold, x 10 reps Standing shoulder ext, row, ER against red Tband with chin tuck to hold ball on wall, 10 reps each. 3 way shoulder elevation with 2# weights x 10 reps. STM to cerv paraspinals, R UT, LS with stretch.   07/14/22: reassessed pts strength Ue's:  R triceps 4+/5, 5/5 for R shoulder flexion, abd, Er/IR but provokes some lateral upper  arm pain/ radiculopathy Cervical spine ROM to R 85% with some tightness/restriction R upper traps region: Manual:Trigger Point Dry Needling  Subsequent Treatment: Instructions provided previously at initial dry needling treatment.  Instructions reviewed, if requested by the patient, prior to subsequent dry needling treatment.   Patient Verbal Consent Given: Yes Education Handout Provided: Previously Provided Muscles Treated: R upper traps, R C5 paraspinals, R infraspinatus Electrical Stimulation Performed: No Treatment Response/Outcome:reduced tissue restriction noted, patient with less pain   Brief deep pressure, cross friction massage R upper traps and infraspinatus  Therex: reviewed and instructed in the following ex to target her postural musculature: Prone R shoulder horizontal abd 15x Seated Red theraband horizontal abduction Red theraband B shoulder ER Standing green T band R shoulder ext Door frame pec stretches , varying hand position/height  Red t band triceps extension, 15 reps  07/07/23  Child's pose x30" Quadruped thread the needle x30" Quadruped thoracic rotation x30" Sidelying open/close book x10 Sitting scap squeeze x10 Sitting shoulder external rotation red TB 2x10 Sitting "W" 2x10 Standing row red TB 2x10 Standing shoulder ext red TB 2x10  Manual therapy: STM & TPR levator scap, periscapular muscles Skilled assessment and palpation for TPDN Trigger Point Dry-Needling  Treatment instructions: Expect mild to moderate muscle soreness. S/S of pneumothorax if dry needled over a lung field, and to seek immediate medical attention should they occur. Patient verbalized understanding of these instructions and education.  Patient Consent Given: Yes Education handout provided: Yes Muscles treated: rhomboid, mid trap, levator scap, thoracic paraspinal Electrical stimulation performed: No Parameters: N/A Treatment response/outcome: Decreased muscle tension  Self  care: Self massage with tennis ball    PATIENT EDUCATION:  Education details: Diagnosis, Prognosis, HEP, POC Person educated: Patient Education method: Explanation, Demonstration, Tactile cues, Verbal cues, and Handouts Education comprehension: verbalized understanding, returned demonstration, verbal cues required, tactile cues required, and needs further education  HOME EXERCISE PROGRAM: Access Code: [email or web address] URL: [email or web address] Date: 06/17/2023 Prepared by: Gardiner Rhyme  Exercises - Open Books  - 1-2 x daily - 7 x weekly - 1-2 sets - 10 reps - Seated Thoracic Extension with Pectoralis Stretch  - 2 x daily - 7 x weekly - 1 sets - 10 reps - Doorway Pec Stretch at 90 Degrees Abduction  - 2 x daily - 7 x weekly - 2-3 sets - 20 seconds hold - Doorway Pec Stretch at 60 Degrees Abduction with  Arm Straight  - 2 x daily - 7 x weekly - 2-3 sets - 20 seconds hold - Seated Scapular Retraction  - 2 x daily - 7 x weekly - 2-3 sets - 10 reps - 5 seconds hold  ASSESSMENT:  CLINICAL IMPRESSION: Pt presents with continued improvement in s/s. Taught patient how to use her home foam roller today and to release tightness independently. Progressed core strength and continued upper back/postural strengthening. She will benefit from remaining visits to maximize independence in HEP and continue to improve tightness.   She does not know specific biomechanical causes, as her pain is not present until she is resting. She notices her pain pre-menstrual cycle, and it is linked to R-sided migraines. Pt was TTP in TTP R cervical spine, UT, LS, medial and inferior R scapula. Cervical cluster was negative for cervical radiculopathy. Right cervical rotation is mildly painful is decreased, compared to L rotation. R shoulder is weaker with MMT as well in all planes with some upper arm discomfort during flexion and abduction testing. Pt has forward rounding of B shoulders R>L and mild forward head. She is  dominant R-handed. Tresa Endo, interpreter present for session. Pt verbalized understanding and consent to treatment and was agreeable to POC. She will benefit from skilled PT 1-2x/week for 6-8 weeks to address impairments in pain, ROM, strength, and postural deficits.  OBJECTIVE IMPAIRMENTS: decreased ROM, decreased strength, increased fascial restrictions, increased muscle spasms, impaired flexibility, improper body mechanics, postural dysfunction, and pain.     GOALS: Goals reviewed with patient? No  SHORT TERM GOALS: Target date: 07/08/23  Pt will I and compliant with initial HEP. Baseline: provided at eval Goal status: INITIAL  2.  Pt will report a decrease in pain level from 8-9/10 at worst to 6-7/10.  Baseline: 8-9/10 at worst with pre-menstrual headaches Goal status: INITIAL  LONG TERM GOALS: Target date: 08/25/22  Pt will independent with long term HEP to address posture, any recurrence, and maintain mobility/strength. Baseline: will provided Goal status: INITIAL  2.  Pt will decrease overall pain level to </= 4/10 at worst.  Baseline: 8-9/10 worst Goal status: 07/22/23-Improving, but not met, ongoing.  3.  Pt will increase R shoulder strength via MMT to at least 4+/5. Baseline: see flow sheet Goal status: INITIAL  4.  Pt will report decrease in overall headache frequency that is related to neck/shoulder pain. Baseline: at least a few times/month during pre-menstrual cycle Goal status: INITIAL  5.  Pt will be able to work a full day without c/o pain afterward. Baseline: pain at home after work Goal status: INITIAL    PLAN:  PT FREQUENCY: 1-2x/week  PT DURATION: 8 weeks  PLANNED INTERVENTIONS: 97110-Therapeutic exercises, 97530- Therapeutic activity, [id number]- Neuromuscular re-education, 97535- Self Care, [id number]- Manual therapy, 97016- Vasopneumatic device, [id number]- Traction (mechanical), [id number]- Ionotophoresis 4mg /ml Dexamethasone, Patient/Family education, Taping, Dry  Needling, Spinal manipulation, Spinal mobilization, Cryotherapy, and Moist heat  PLAN FOR NEXT SESSION: DN (discussed with pt during eval and she would like to try at least once), review and advance therex, continue to reassess R UE strength    Marcelline Mates, PT, DPT 08/05/2023, 8:12 AM

## 2023-08-19 ENCOUNTER — Ambulatory Visit: Payer: No Typology Code available for payment source | Attending: Family Medicine

## 2023-08-19 DIAGNOSIS — R293 Abnormal posture: Secondary | ICD-10-CM | POA: Insufficient documentation

## 2023-08-19 DIAGNOSIS — M542 Cervicalgia: Secondary | ICD-10-CM | POA: Insufficient documentation

## 2023-08-19 DIAGNOSIS — M6281 Muscle weakness (generalized): Secondary | ICD-10-CM | POA: Insufficient documentation

## 2023-08-19 NOTE — Therapy (Signed)
 OUTPATIENT PHYSICAL THERAPY CERVICAL TREATMENT   Patient Name: Alicia Werner MRN: [id number] DOB:02/07/1981, 43 y.o., female Today's Date: 08/19/2023  END OF SESSION:  Past Medical History:  Diagnosis Date   Allergy     Vaginal delivery 2008, 2010   Past Surgical History:  Procedure Laterality Date   CESAREAN SECTION     CESAREAN SECTION WITH BILATERAL TUBAL LIGATION N/A 03/28/2014   Procedure: CESAREAN SECTION WITH BILATERAL TUBAL LIGATION;  Surgeon: Jolene Gaskins, MD;  Location: [facility];  Service: Obstetrics;  Laterality: N/A;   FACIAL COSMETIC SURGERY  07/12/2001   Patient Active Problem List   Diagnosis Date Noted   Recurrent sinusitis 11/23/2016   Cesarean delivery delivered 03/28/2014   Allergic rhinitis due to other allergen 09/09/2012   Other chronic allergic conjunctivitis 09/09/2012   Angioedema 09/09/2012   Other atopic dermatitis and related conditions 09/09/2012    PCP: Almarie Waddell NOVAK, NP  REFERRING PROVIDER: Almarie Waddell NOVAK, NP  REFERRING DIAG:  M54.9 (ICD-10-CM) - Upper back pain  M62.838 (ICD-10-CM) - Muscle spasm    THERAPY DIAG:  No diagnosis found.  Rationale for Evaluation and Treatment: Rehabilitation  ONSET DATE: 2 months ago 04/12/23  SUBJECTIVE:                                                                                                                                                                                                         SUBJECTIVE STATEMENT: Pt reports that she is ok, had some tightness above the right shoulder.   Hand dominance: Right  PERTINENT HISTORY:  Cesarean with bilateral tubal ligation 03/28/2014  PAIN:  Are you having pain? No  PRECAUTIONS: None  RED FLAGS: None     WEIGHT BEARING RESTRICTIONS: No  FALLS:  Has patient fallen in last 6 months? 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  PLOF: Independent  PATIENT GOALS: pain relief  NEXT MD VISIT: as needed   OBJECTIVE:  Note: Objective measures were completed at Evaluation unless otherwise noted.  POSTURE: rounded shoulders and forward head  PALPATION: TTP R cervical spine, UT, LS, medial and inferior R scapula   CERVICAL ROM:   Active ROM A/PROM (deg) eval  Flexion 50  Extension 60  Right lateral flexion 40  Left lateral flexion 40  Right rotation 60*  Left rotation 75   (Blank rows = not tested)  UPPER EXTREMITY ROM:  Active ROM Right eval Left eval  Shoulder flexion [facility]  Shoulder extension    Shoulder abduction [facility]  Shoulder adduction    Shoulder extension    Shoulder internal rotation T8* WFL  Shoulder external rotation [facility]  Elbow flexion    Elbow extension    Wrist flexion    Wrist extension    Wrist ulnar deviation    Wrist radial deviation    Wrist pronation    Wrist supination     (Blank rows = not tested)  UPPER EXTREMITY MMT:  MMT Right eval Left eval  Shoulder flexion 4/5 5/5  Shoulder extension    Shoulder abduction 4/5 5/5  Shoulder adduction    Shoulder extension    Shoulder internal rotation 4/5 5/5  Shoulder external rotation 4-/5 4+/5  Middle trapezius    Lower trapezius    Elbow flexion    Elbow extension    Wrist flexion    Wrist extension    Wrist ulnar deviation    Wrist radial deviation    Wrist pronation    Wrist supination    Grip strength     (Blank rows = not tested)  CERVICAL SPECIAL TESTS:  Spurling's test: Negative and Distraction test: Negative  FUNCTIONAL TESTS:  NT  TODAY'S TREATMENT:                                                                                                                              DATE:  08/19/23 UE Bike L1x80min each way Supine Open Books 2x10 Shoulder Rows 20# 2x10  Lat Pull Downs 25# 2x10   Shoulder Extensions 5# 2x8 Right shoulder Green  Band IR 2x12 Right shoulder Green  Band ER 2x12 Neck Stretching 2x20 sec: flexion, side bending, levator scap  [facility] Adult PT Treatment:                                                DATE: 08/05/23 Therapeutic Exercise: STM with firm ball superior to R scap --> shoulder flexion x10, abduction x10, circles x8 B FR T/S ext 2x10 then rolling FR suboccipital release  QP HBH T/S rotation x8 B then thread the needle S hold x15 each QP UE reach x 5 B, LE reach x5 B, bird dog x5 B Child's pose forward and sidebending Rows 20# 2x10 cues for grip, shoulder depression/UT relaxation LAT Pulls 25# 2x10 cues for grip and shoulder depression (2nd set, wide grip) Elevated plank on table x20, side plank B x20 Serratus press on table x10    07/29/23 UBE L1 x 3 min forward and back Prone over physioball, shoulder ext, horizontal abd, flex in scaption. X 10 each 1# weights AR press, 10#, 2 x 10 reps each side. Lat pulls, 20#, 2 x 10 Rows, 20#,  2 x 10 reps Seated dips, partial, x 10 Deep pressure and STM to R UT, particularly on trigger point, F/B stretch  07/22/23 UBE L1 x 3 min for and back Seated press ups, 5 sec hold, x 10 reps Standing shoulder ext, row, ER against red Tband with chin tuck to hold ball on wall, 10 reps each. 3 way shoulder elevation with 2# weights x 10 reps. STM to cerv paraspinals, R UT, LS with stretch.   07/14/22: reassessed pts strength Ue's:  R triceps 4+/5, 5/5 for R shoulder flexion, abd, Er/IR but provokes some lateral upper arm pain/ radiculopathy Cervical spine ROM to R 85% with some tightness/restriction R upper traps region: Manual:Trigger Point Dry Needling  Subsequent Treatment: Instructions provided previously at initial dry needling treatment.  Instructions reviewed, if requested by the patient, prior to subsequent dry needling treatment.   Patient Verbal Consent Given: Yes Education Handout Provided:  Previously Provided Muscles Treated: R upper traps, R C5 paraspinals, R infraspinatus Electrical Stimulation Performed: No Treatment Response/Outcome:reduced tissue restriction noted, patient with less pain   Brief deep pressure, cross friction massage R upper traps and infraspinatus  Therex: reviewed and instructed in the following ex to target her postural musculature: Prone R shoulder horizontal abd 15x Seated Red theraband horizontal abduction Red theraband B shoulder ER Standing green T band R shoulder ext Door frame pec stretches , varying hand position/height  Red t band triceps extension, 15 reps  07/07/23  Child's pose x30 Quadruped thread the needle x30 Quadruped thoracic rotation x30 Sidelying open/close book x10 Sitting scap squeeze x10 Sitting shoulder external rotation red TB 2x10 Sitting W 2x10 Standing row red TB 2x10 Standing shoulder ext red TB 2x10  Manual therapy: STM & TPR levator scap, periscapular muscles Skilled assessment and palpation for TPDN Trigger Point Dry-Needling  Treatment instructions: Expect mild to moderate muscle soreness. S/S of pneumothorax if dry needled over a lung field, and to seek immediate medical attention should they occur. Patient verbalized understanding of these instructions and education.  Patient Consent Given: Yes Education handout provided: Yes Muscles treated: rhomboid, mid trap, levator scap, thoracic paraspinal Electrical stimulation performed: No Parameters: N/A Treatment response/outcome: Decreased muscle tension  Self care: Self massage with tennis ball    PATIENT EDUCATION:  Education details: Diagnosis, Prognosis, HEP, POC Person educated: Patient Education method: Explanation, Demonstration, Tactile cues, Verbal cues, and Handouts Education comprehension: verbalized understanding, returned demonstration, verbal cues required, tactile cues required, and needs further education  HOME EXERCISE  PROGRAM: Access Code: [email or web address] URL: [email or web address] Date: 06/17/2023 Prepared by: Izetta Fordyce  Exercises - Open Books  - 1-2 x daily - 7 x weekly - 1-2 sets - 10 reps - Seated Thoracic Extension with Pectoralis Stretch  - 2 x daily - 7 x weekly - 1 sets - 10 reps - Doorway Pec Stretch at 90 Degrees Abduction  - 2 x daily - 7 x weekly - 2-3 sets - 20 seconds hold - Doorway Pec Stretch at 60 Degrees Abduction with Arm Straight  - 2 x daily - 7 x weekly - 2-3 sets - 20 seconds hold - Seated Scapular Retraction  - 2 x daily - 7 x weekly - 2-3 sets - 10 reps - 5 seconds hold  ASSESSMENT:  CLINICAL IMPRESSION: Pt presented to clinic with upper back and neck issues. She didn't report pain, but reported tightness in her right shoulder today. We were able to progress many of her strengthening exercises. She required extra cuing to  relax her upper traps when completing strengthening exercises. We were able to do IR and ER with her right shoulder. We utilized a small towel to cue keeping her elbow by her side, but she still needs continuous cuing to keep her thumb pointed to the ceiling and to not turn her entire upper body during the rep. Pt will continue to benefit from skilled PT to progress strength for long term benefits of pain reduction and ease of ADLs.   From Eval: She does not know specific biomechanical causes, as her pain is not present until she is resting. She notices her pain pre-menstrual cycle, and it is linked to R-sided migraines. Pt was TTP in TTP R cervical spine, UT, LS, medial and inferior R scapula. Cervical cluster was negative for cervical radiculopathy. Right cervical rotation is mildly painful is decreased, compared to L rotation. R shoulder is weaker with MMT as well in all planes with some upper arm discomfort during flexion and abduction testing. Pt has forward rounding of B shoulders R>L and mild forward head. She is dominant R-handed. Burnard, interpreter  present for session. Pt verbalized understanding and consent to treatment and was agreeable to POC. She will benefit from skilled PT 1-2x/week for 6-8 weeks to address impairments in pain, ROM, strength, and postural deficits.  OBJECTIVE IMPAIRMENTS: decreased ROM, decreased strength, increased fascial restrictions, increased muscle spasms, impaired flexibility, improper body mechanics, postural dysfunction, and pain.     GOALS: Goals reviewed with patient? No  SHORT TERM GOALS: Target date: 07/08/23  Pt will I and compliant with initial HEP. Baseline: provided at eval Goal status: INITIAL  2.  Pt will report a decrease in pain level from 8-9/10 at worst to 6-7/10.  Baseline: 8-9/10 at worst with pre-menstrual headaches Goal status: INITIAL  LONG TERM GOALS: Target date: 08/25/22  Pt will independent with long term HEP to address posture, any recurrence, and maintain mobility/strength. Baseline: will provided Goal status: INITIAL  2.  Pt will decrease overall pain level to </= 4/10 at worst.  Baseline: 8-9/10 worst Goal status: 07/22/23-Improving, but not met, ongoing.  3.  Pt will increase R shoulder strength via MMT to at least 4+/5. Baseline: see flow sheet Goal status: INITIAL  4.  Pt will report decrease in overall headache frequency that is related to neck/shoulder pain. Baseline: at least a few times/month during pre-menstrual cycle Goal status: INITIAL  5.  Pt will be able to work a full day without c/o pain afterward. Baseline: pain at home after work Goal status: INITIAL    PLAN:  PT FREQUENCY: 1-2x/week  PT DURATION: 8 weeks  PLANNED INTERVENTIONS: 97110-Therapeutic exercises, 97530- Therapeutic activity, [id number]- Neuromuscular re-education, 97535- Self Care, [id number]- Manual therapy, 97016- Vasopneumatic device, [id number]- Traction (mechanical), [phone]- Ionotophoresis 4mg /ml Dexamethasone, Patient/Family education, Taping, Dry Needling, Spinal manipulation, Spinal  mobilization, Cryotherapy, and Moist heat  PLAN FOR NEXT SESSION: DN (discussed with pt during eval and she would like to try at least once), review and advance therex, continue to reassess R UE strength    Larraine Nordmann, Student-PT 08/19/2023, 8:00 AM

## 2023-08-26 ENCOUNTER — Ambulatory Visit: Payer: No Typology Code available for payment source | Admitting: Physical Therapy

## 2023-09-03 ENCOUNTER — Other Ambulatory Visit (HOSPITAL_BASED_OUTPATIENT_CLINIC_OR_DEPARTMENT_OTHER): Payer: Self-pay

## 2023-09-05 ENCOUNTER — Other Ambulatory Visit (HOSPITAL_BASED_OUTPATIENT_CLINIC_OR_DEPARTMENT_OTHER): Payer: Self-pay

## 2023-09-06 ENCOUNTER — Other Ambulatory Visit (HOSPITAL_BASED_OUTPATIENT_CLINIC_OR_DEPARTMENT_OTHER): Payer: Self-pay
